# Patient Record
Sex: Female | Born: 1950 | Race: White | Hispanic: No | Smoking: Current every day smoker
Health system: Southern US, Community
[De-identification: ages and names within clinical notes are randomized; demographics above are authoritative.]

## PROBLEM LIST (undated history)

## (undated) DIAGNOSIS — I10 Essential (primary) hypertension: Secondary | ICD-10-CM

## (undated) DIAGNOSIS — R011 Cardiac murmur, unspecified: Secondary | ICD-10-CM

## (undated) DIAGNOSIS — M8580 Other specified disorders of bone density and structure, unspecified site: Secondary | ICD-10-CM

## (undated) DIAGNOSIS — Z780 Asymptomatic menopausal state: Secondary | ICD-10-CM

## (undated) DIAGNOSIS — K219 Gastro-esophageal reflux disease without esophagitis: Secondary | ICD-10-CM

## (undated) DIAGNOSIS — E785 Hyperlipidemia, unspecified: Secondary | ICD-10-CM

## (undated) DIAGNOSIS — A6009 Herpesviral infection of other urogenital tract: Secondary | ICD-10-CM

## (undated) HISTORY — DX: Other specified disorders of bone density and structure, unspecified site: M85.80

## (undated) HISTORY — DX: Asymptomatic menopausal state: Z78.0

## (undated) HISTORY — DX: Herpesviral infection of other urogenital tract: A60.09

## (undated) HISTORY — DX: Hyperlipidemia, unspecified: E78.5

## (undated) HISTORY — DX: Gastro-esophageal reflux disease without esophagitis: K21.9

## (undated) HISTORY — DX: Cardiac murmur, unspecified: R01.1

## (undated) HISTORY — DX: Essential (primary) hypertension: I10

---

## 1968-12-31 HISTORY — PX: GANGLION CYST EXCISION: SHX1691

## 2000-01-16 ENCOUNTER — Other Ambulatory Visit: Admission: RE | Admit: 2000-01-16 | Discharge: 2000-01-16 | Payer: Self-pay | Admitting: Obstetrics and Gynecology

## 2004-01-01 DIAGNOSIS — A6009 Herpesviral infection of other urogenital tract: Secondary | ICD-10-CM

## 2004-01-01 HISTORY — DX: Herpesviral infection of other urogenital tract: A60.09

## 2004-01-01 HISTORY — PX: PAROTID GLAND TUMOR EXCISION: SHX5221

## 2005-02-13 ENCOUNTER — Ambulatory Visit (HOSPITAL_COMMUNITY): Admission: RE | Admit: 2005-02-13 | Discharge: 2005-02-13 | Payer: Self-pay | Admitting: Family Medicine

## 2005-02-20 ENCOUNTER — Ambulatory Visit: Payer: Self-pay | Admitting: Internal Medicine

## 2005-02-22 ENCOUNTER — Ambulatory Visit: Payer: Self-pay | Admitting: Internal Medicine

## 2005-02-22 ENCOUNTER — Ambulatory Visit: Admission: RE | Admit: 2005-02-22 | Discharge: 2005-02-22 | Payer: Self-pay | Admitting: Internal Medicine

## 2011-02-07 ENCOUNTER — Encounter: Payer: Self-pay | Admitting: Internal Medicine

## 2011-02-12 ENCOUNTER — Encounter: Payer: Self-pay | Admitting: Internal Medicine

## 2011-02-12 ENCOUNTER — Institutional Professional Consult (permissible substitution) (INDEPENDENT_AMBULATORY_CARE_PROVIDER_SITE_OTHER): Payer: Self-pay | Admitting: Internal Medicine

## 2011-02-12 DIAGNOSIS — R042 Hemoptysis: Secondary | ICD-10-CM | POA: Insufficient documentation

## 2011-02-12 DIAGNOSIS — F17200 Nicotine dependence, unspecified, uncomplicated: Secondary | ICD-10-CM

## 2011-02-21 NOTE — Assessment & Plan Note (Signed)
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 Cleta Alberts Primary Provider/Referring Provider:  Dr. Cleta Alberts  CC:  Hemoptysis.  History of Present Illness: 60 yowf smoker eval for hemopytis  2006 at [facility]   with CT Chest and FOB all negative and resolved but continued to smoker with smoker's cough.    February 12, 2011   1st pulmonary office eval in EMR era cc new onset hemoptysis in setting of worse cough than usual while on way to [place] Jan 31  by car then p returned to [facility]   Feb 6 acute onset brb hemoptysis =    1 tsp x 3  ,  next day coughed up a couple of clots and since then started on abx Feb 8th  and  quit smoking and no blood since,  smokers cough gone.  no epistaxis .   Pt denies any significant sore throat, dysphagia, itching, sneezing,  nasal congestion or excess secretions,  fever, chills, sweats, unintended wt loss, pleuritic or exertional cp, hempoptysis, change in activity tolerance  orthopnea pnd or leg swelling  Pt also denies any obvious fluctuation in symptoms with weather or environmental change or other alleviating or aggravating factors.       Current Medications (verified): 1)  Doxycycline Hyclate 100 Mg Tabs (Doxycycline Hyclate) .Marland Kitchen.. 1 Two Times A Day Until Gone Per Dr Cleta Alberts  Allergies (verified): 1)  ! 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  Quit first of Feb 2012.  Smoked for approx 40 yrs up to 1 1/2 ppd. ETOH- wine occ  Review of Systems       The patient complains of coughing up blood.  The patient denies shortness of breath with activity, shortness of breath at rest, productive cough, non-productive cough, chest pain, irregular  heartbeats, acid heartburn, indigestion, loss of appetite, weight change, abdominal pain, difficulty swallowing, sore throat, tooth/dental problems, headaches, nasal congestion/difficulty breathing through nose, sneezing, itching, ear ache, anxiety, depression, hand/feet swelling, joint stiffness or pain, rash, change in color of mucus, and fever.    Vital Signs:  Patient profile:   60 year old female Height:      60.5 inches Weight:      114.25 pounds BMI:     22.02 O2 Sat:      100 % on Room air Temp:     98.3 degrees F oral Pulse rate:   74 / minute BP sitting:   150 / 66  (left arm)  Vitals Entered By: Vernie Murders (February 12, 2011 11:15 AM)  O2 Flow:  Room air  Physical Exam  Additional Exam:  anxious wf nad wt 114 February 12, 2011  HEENT: nl dentition, turbinates, and orophanx. Nl external ear canals without cough reflex NECK :  without JVD/Nodes/TM/ nl carotid upstrokes bilaterally LUNGS: no acc muscle use, clear to A and P bilaterally without cough on insp or exp maneuvers CV:  RRR  no s3 or murmur or increase in P2, no edema  ABD:  soft and nontender with nl excursion in the supine position. No bruits or organomegaly, bowel sounds nl MS:  warm without deformities, calf  tenderness, cyanosis or clubbing SKIN: warm and dry without lesions   NEURO:  alert, approp, no deficits     CXR  Procedure date:  02/07/2011  Findings:      wnl   Impression & Recommendations:  Problem # 1:  HEMOPTYSIS UNSPECIFIED (ICD-786.30)  Her updated medication list for this problem includes:    Doxycycline Hyclate 100 Mg Tabs (Doxycycline hyclate) .Marland Kitchen... 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   Discussed in detail all the  indications, usual  risks and alternatives  relative to the benefits with  patient who agrees to proceed with conservative f/u off cigarttes  Orders: Consultation Level III (04540)  Problem # 2:  SMOKER (ICD-305.1)  I reviewed the Flethcher curve with her that basically says if you quit smoking when your best day FEV1 is still well preserved it is highly unlikely you will progress to severe disease and informed the patient there was no medication on the market that has proven to change the curve or the likelihood of progression   Orders: Consultation Level III (98119)  Medications Added to Medication List This Visit: 1)  Doxycycline Hyclate 100 Mg Tabs (Doxycycline hyclate) .Marland Kitchen.. 1 two times a day until gone per dr Cleta Alberts  Patient Instructions: 1)  Most important of your care is stopping smoking now 2)  If coughing up blood comes back even traces then need to repeat a CT Chest and Sinus (call Almyra Free at [phone]) 3)  If your breathing and cough are not satisfactory after 6 weeks please schedule an office visit

## 2012-01-12 ENCOUNTER — Encounter: Payer: Self-pay | Admitting: Family Medicine

## 2012-02-04 ENCOUNTER — Encounter: Payer: Self-pay | Admitting: Family Medicine

## 2012-02-04 ENCOUNTER — Ambulatory Visit: Payer: Self-pay | Admitting: Family Medicine

## 2012-02-04 DIAGNOSIS — I10 Essential (primary) hypertension: Secondary | ICD-10-CM

## 2012-02-04 DIAGNOSIS — M8580 Other specified disorders of bone density and structure, unspecified site: Secondary | ICD-10-CM

## 2012-02-04 DIAGNOSIS — Z0000 Encounter for general adult medical examination without abnormal findings: Secondary | ICD-10-CM

## 2012-02-04 MED ORDER — LISINOPRIL-HYDROCHLOROTHIAZIDE 20-12.5 MG PO TABS: 1.0000 | ORAL_TABLET | Freq: Every day | ORAL | Status: DC

## 2012-02-04 NOTE — Assessment & Plan Note (Addendum)
Increase lisinopril-Hct to 20/12.5 daily Call with BP readings in 6 weeks Follow low salt diet Exercise 5 days a week 30 min day Draw CMET/LIPID

## 2012-02-04 NOTE — Progress Notes (Signed)
  Subjective:    Patient ID: Anita Calderon, female    DOB: April 26, 1951, 61 y.o.   MRN: [id number]  HPI Presents in follow up of hypertension. Compliant with meds without s/e. Not adding salt to foods. Exersising about 3 days a week.  Recent URI however still with post nasal drainage.  Review of Systems  Constitutional: Negative.   Respiratory: Negative for chest tightness.   Cardiovascular: Negative for chest pain.  Neurological: Negative for dizziness, weakness and light-headedness.       Objective:   Physical Exam  Constitutional: She appears well-developed and well-nourished.  HENT:  Mouth/Throat: Oropharyngeal exudate: clear pnd.  Neck: Neck supple. No thyromegaly present.  Cardiovascular: Normal rate, regular rhythm and normal heart sounds.  Decreased pulses: no lower ext edema.   Pulmonary/Chest: Effort normal and breath sounds normal.          Assessment & Plan:   1. HTN (hypertension)  lisinopril-hydrochlorothiazide (ZESTORETIC) 20-12.5 MG per tablet, Lipid panel, Comprehensive metabolic panel. Call with BP readings in 6 weeks.  2. Osteopenia    3. Health care maintenance     Follow up 6 months.

## 2012-02-04 NOTE — Assessment & Plan Note (Addendum)
  Pt to call [facility] and look into scholarshiip for free mammogram

## 2012-02-04 NOTE — Patient Instructions (Addendum)
Increase lisinopril-Hct to 20/12.5 daily Call with BP readings in 6 weeks Follow low salt diet Exercise 5 days a week 30 min day We drew today CMET/LIPID panel  Pt to call [facility] and look into scholarshiip for free mammogram

## 2012-02-05 LAB — COMPREHENSIVE METABOLIC PANEL
ALT: 11 U/L (ref 0–35)
BUN: 9 mg/dL (ref 6–23)
Chloride: 101 mEq/L (ref 96–112)
Creat: 0.71 mg/dL (ref 0.50–1.10)
Sodium: 134 mEq/L — ABNORMAL LOW (ref 135–145)
Total Bilirubin: 0.5 mg/dL (ref 0.3–1.2)
Total Protein: 6.7 g/dL (ref 6.0–8.3)

## 2012-02-05 LAB — LIPID PANEL
Cholesterol: 221 mg/dL — ABNORMAL HIGH (ref 0–200)
HDL: 73 mg/dL (ref >39)

## 2012-02-06 NOTE — Progress Notes (Signed)
Quick Note:  Please call and send patient copy of normal labs. Thanks! KR  ______

## 2012-06-10 ENCOUNTER — Ambulatory Visit: Payer: Self-pay | Admitting: Family Medicine

## 2012-06-10 VITALS — BP 150/78 | HR 71 | Temp 98.0°F | Resp 18 | Ht 61.0 in | Wt 111.0 lb

## 2012-06-10 DIAGNOSIS — H5789 Other specified disorders of eye and adnexa: Secondary | ICD-10-CM

## 2012-06-10 DIAGNOSIS — J310 Chronic rhinitis: Secondary | ICD-10-CM

## 2012-06-10 DIAGNOSIS — H5710 Ocular pain, unspecified eye: Secondary | ICD-10-CM

## 2012-06-10 MED ORDER — POLYMYXIN B-TRIMETHOPRIM 10000-0.1 UNIT/ML-% OP SOLN: 1.0000 [drp] | OPHTHALMIC | Status: AC

## 2012-06-10 NOTE — Progress Notes (Signed)
Patient Name: Anita Calderon Date of Birth: [birth year] Medical Record Number: [id number] Gender: female Date of Encounter: 06/10/2012  History of Present Illness:  Anita Calderon is a 62 y.o. very pleasant female patient who presents with the following:  Both eyes have felt irritated, "achy" and looked red for the last couple of months.  She has noted some "green and yellow" discharge for several weeks as well.  Vision is ok.  She does not wear any corrective lenses.  She has tried moisturizing drops, and anti- redness drops every other day or so. No photophobia.    Patient Active Problem List  Diagnoses  . SMOKER  . HEMOPTYSIS UNSPECIFIED  . HTN (hypertension)  . Osteopenia  . Health care maintenance   Past Medical History  Diagnosis Date  . Herpes genitalis in women 2005  . Hyperlipidemia   . Menopause   . Hypertension   . GERD (gastroesophageal reflux disease)   . Osteopenia    Past Surgical History  Procedure Date  . Ganglion cyst excision 1970  . Parotid gland tumor excision 2005   History  Substance Use Topics  . Smoking status: Never Smoker   . Smokeless tobacco: Not on file  . Alcohol Use: Not on file   Family History  Problem Relation Age of Onset  . Hypertension Father   . Heart disease Father   . Brain cancer Mother    Allergies  Allergen Reactions  . Ciprofloxacin     REACTION: hives    Medication list has been reviewed and updated.  Prior to Admission medications   Medication Sig Start Date End Date Taking? Authorizing Provider  lisinopril-hydrochlorothiazide (ZESTORETIC) 20-12.5 MG per tablet Take 1 tablet by mouth daily. 02/04/12 02/03/13 Yes Dois Davenport, MD  omeprazole (PRILOSEC) 10 MG capsule Take 10 mg by mouth daily.   Yes Dois Davenport, MD  aspirin 81 MG tablet Take 81 mg by mouth daily.    Dois Davenport, MD  calcium citrate-vitamin D 200-200 MG-UNIT TABS Take 1 tablet by mouth daily.    Dois Davenport, MD  fish oil-omega-3 fatty acids  1000 MG capsule Take 2 g by mouth daily.    Dois Davenport, MD    Review of Systems:  As per HPI- otherwise negative.   Physical Examination: Filed Vitals:   06/10/12 0925  BP: 150/78  Pulse: 71  Temp: 98 F (36.7 C)  Resp: 18   Filed Vitals:   06/10/12 0925  Height: 5\' 1"  (1.549 m)  Weight: 111 lb (50.349 kg)   Body mass index is 20.97 kg/(m^2).  GEN: WDWN, NAD, Non-toxic, A & O x 3 HEENT: Atraumatic, Normocephalic. Neck supple. No masses, No LAD.  Tm, nasal cavity and oropharynx wnl PEERL, EOMI.  Eyes are slightly injected and muddy bilaterally- appear irritated.  Conjunctivae slightly injected only.  Fundoscopic exam wnl Ears and Nose: No external deformity. CV: RRR, No M/G/R. No JVD. No thrill. No extra heart sounds. PULM: CTA B, no wheezes, crackles, rhonchi. No retractions. No resp. distress. No accessory muscle use. EXTR: No c/c/e NEURO Normal gait.  PSYCH: Normally interactive. Conversant. Not depressed or anxious appearing.  Calm demeanor.   Assessment and Plan: 1. Eye irritation  trimethoprim-polymyxin b (POLYTRIM) ophthalmic solution   Suspect that Iyari has an element of allergic conjunctivitis.  Will treat as above with polytrim for about a week, and also recommended that she buy GenTeal opthalmic lubricating gel.  She will call if not  better in the next week or so.  Sooner if worse.     Abbe Amsterdam, MD

## 2012-08-04 ENCOUNTER — Ambulatory Visit: Payer: Self-pay | Admitting: Family Medicine

## 2012-09-18 ENCOUNTER — Encounter: Payer: Self-pay | Admitting: Family Medicine

## 2012-09-18 ENCOUNTER — Ambulatory Visit: Payer: Self-pay | Admitting: Family Medicine

## 2012-09-18 VITALS — BP 142/74 | HR 75 | Temp 97.9°F | Resp 16 | Ht 61.0 in | Wt 110.0 lb

## 2012-09-18 DIAGNOSIS — I10 Essential (primary) hypertension: Secondary | ICD-10-CM

## 2012-09-18 LAB — COMPREHENSIVE METABOLIC PANEL
ALT: 15 U/L (ref 0–35)
AST: 18 U/L (ref 0–37)
Albumin: 5.1 g/dL (ref 3.5–5.2)
Alkaline Phosphatase: 59 U/L (ref 39–117)
BUN: 10 mg/dL (ref 6–23)
CO2: 26 mEq/L (ref 19–32)
Calcium: 9.9 mg/dL (ref 8.4–10.5)
Chloride: 95 mEq/L — ABNORMAL LOW (ref 96–112)
Creat: 0.76 mg/dL (ref 0.50–1.10)
Glucose, Bld: 89 mg/dL (ref 70–99)
Potassium: 4.1 mEq/L (ref 3.5–5.3)
Sodium: 134 mEq/L — ABNORMAL LOW (ref 135–145)
Total Bilirubin: 0.6 mg/dL (ref 0.3–1.2)
Total Protein: 7.3 g/dL (ref 6.0–8.3)

## 2012-09-18 LAB — TSH: TSH: 1.07 u[IU]/mL (ref 0.350–4.500)

## 2012-09-18 NOTE — Progress Notes (Signed)
61 yo woman with hypertension, treated with lisinopril HCT for 18 months.  Still smoking.   C/o GERD  In past 4 months, patient has had several episodes of diaphoresis, weakness.  These episodes are brief.  One episode associated with vomiting.  Objective:  Alert, articulate HEENT:  Unremarkable Neck:  Faint bruit right carotid area Chest: clear Heart:  II/VI RSB systolic murmur Extrem:  Normal  Had cardiac eval 2 years ago with echo which was normal.  Assessment:  Atypical episodes of brief presyncope.  Plan:  1. Hypertension  Comprehensive metabolic panel, TSH

## 2012-09-18 NOTE — Patient Instructions (Signed)

## 2013-04-01 ENCOUNTER — Other Ambulatory Visit: Payer: Self-pay | Admitting: Family Medicine

## 2013-05-04 ENCOUNTER — Encounter: Payer: Self-pay | Admitting: Family Medicine

## 2013-05-04 ENCOUNTER — Ambulatory Visit (INDEPENDENT_AMBULATORY_CARE_PROVIDER_SITE_OTHER): Payer: Self-pay | Admitting: Family Medicine

## 2013-05-04 VITALS — BP 130/70 | HR 66 | Temp 98.0°F | Resp 16 | Ht 61.0 in | Wt 106.0 lb

## 2013-05-04 DIAGNOSIS — I10 Essential (primary) hypertension: Secondary | ICD-10-CM

## 2013-05-04 MED ORDER — LISINOPRIL-HYDROCHLOROTHIAZIDE 20-12.5 MG PO TABS: 1.0000 | ORAL_TABLET | Freq: Every day | ORAL | Status: DC

## 2013-05-04 NOTE — Progress Notes (Signed)
Patient ID: Anita Calderon MRN: [id number], DOB: April 05, 1951, 62 y.o. Date of Encounter: 05/04/2013, 12:17 PM  Primary Physician: No primary provider on file.  Chief Complaint: HTN  HPI: 62 y.o. year old female with history below presents for hypertension follow up.  Diet consists of No CP, HA, visual changes, or focal deficits.   Past Medical History  Diagnosis Date  . Herpes genitalis in women 2005  . Hyperlipidemia   . Menopause   . Hypertension   . GERD (gastroesophageal reflux disease)   . Osteopenia      Home Meds: Prior to Admission medications   Medication Sig Start Date End Date Taking? Authorizing Provider  lisinopril-hydrochlorothiazide (PRINZIDE,ZESTORETIC) 20-12.5 MG per tablet take 1 tablet by mouth once daily 04/01/13  Yes Sondra Barges, PA-C    Allergies:  Allergies  Allergen Reactions  . Ciprofloxacin     REACTION: hives    History   Social History  . Marital Status: Married    Spouse Name: N/A    Number of Children: N/A  . Years of Education: N/A   Occupational History  . Not on file.   Social History Main Topics  . Smoking status: Current Every Day Smoker    Last Attempt to Quit: 04/11/2010  . Smokeless tobacco: Not on file  . Alcohol Use: Not on file  . Drug Use: No  . Sexually Active: Not on file   Other Topics Concern  . Not on file   Social History Narrative  . No narrative on file     Family History  Problem Relation Age of Onset  . Hypertension Father   . Heart disease Father   . Brain cancer Mother     Review of Systems: Constitutional: negative for chills, fever, night sweats, weight changes, or fatigue  HEENT: negative for vision changes, hearing loss, congestion, rhinorrhea, ST, epistaxis, or sinus pressure Cardiovascular: negative for chest pain, palpitations, or DOE Respiratory: negative for hemoptysis, wheezing, shortness of breath, or cough Abdominal: negative for abdominal pain, nausea, vomiting, diarrhea, or  constipation Dermatological: negative for rash Neurologic: negative for headache, dizziness, or syncope All other systems reviewed and are otherwise negative with the exception to those above and in the HPI.   Physical Exam: Blood pressure 130/70, pulse 66, temperature 98 F (36.7 C), temperature source Oral, resp. rate 16, height 5\' 1"  (1.549 m), weight 106 lb (48.081 kg), SpO2 100.00%., Body mass index is 20.04 kg/(m^2). General: Well developed, well nourished, in no acute distress. Head: Normocephalic, atraumatic, eyes without discharge, sclera non-icteric, nares are without discharge. Bilateral auditory canals clear, TM's are without perforation, pearly grey and translucent with reflective cone of light bilaterally. Oral cavity moist, posterior pharynx without exudate, erythema, peritonsillar abscess, or post nasal drip.  Neck: Supple. No thyromegaly. Full ROM. No lymphadenopathy. No carotid bruits. Lungs: Clear bilaterally to auscultation without wheezes, rales, or rhonchi. Breathing is unlabored. Heart: RRR with S1 S2. No murmurs, rubs, or gallops appreciated.  Abdomen: Soft, non-tender, non-distended with normoactive bowel sounds. No hepatosplenomegaly. No rebound/guarding. No obvious abdominal masses. Msk:  Strength and tone normal for age. Extremities/Skin: Warm and dry. No clubbing or cyanosis. No edema. No rashes or suspicious lesions. Distal pulses 2+ and equal bilaterally. Neuro: Alert and oriented X 3. Moves all extremities spontaneously. Gait is normal. CNII-XII grossly in tact. DTR 2+, cerebellar function intact. Rhomberg normal. Psych:  Responds to questions appropriately with a normal affect.    ASSESSMENT AND PLAN:  62 y.o. year  old female with chronic hypertension  Hypertension - Plan: lisinopril-hydrochlorothiazide (PRINZIDE,ZESTORETIC) 20-12.5 MG per tablet    - Advise smding cessation Signed, Elvina Sidle, MD 05/04/2013 12:17 PM

## 2013-11-18 ENCOUNTER — Ambulatory Visit: Payer: Self-pay | Admitting: Family Medicine

## 2013-11-18 VITALS — BP 166/68 | HR 73 | Temp 98.6°F | Resp 16 | Ht 61.0 in | Wt 109.4 lb

## 2013-11-18 DIAGNOSIS — I10 Essential (primary) hypertension: Secondary | ICD-10-CM

## 2013-11-18 DIAGNOSIS — E871 Hypo-osmolality and hyponatremia: Secondary | ICD-10-CM

## 2013-11-18 DIAGNOSIS — J209 Acute bronchitis, unspecified: Secondary | ICD-10-CM

## 2013-11-18 DIAGNOSIS — R05 Cough: Secondary | ICD-10-CM

## 2013-11-18 LAB — COMPREHENSIVE METABOLIC PANEL
ALT: 10 U/L (ref 0–35)
AST: 15 U/L (ref 0–37)
Albumin: 4.6 g/dL (ref 3.5–5.2)
Alkaline Phosphatase: 75 U/L (ref 39–117)
BUN: 8 mg/dL (ref 6–23)
Calcium: 9.6 mg/dL (ref 8.4–10.5)
Glucose, Bld: 94 mg/dL (ref 70–99)
Total Protein: 6.8 g/dL (ref 6.0–8.3)

## 2013-11-18 MED ORDER — DOXYCYCLINE HYCLATE 100 MG PO TABS: 100.0000 mg | ORAL_TABLET | Freq: Two times a day (BID) | ORAL | Status: DC

## 2013-11-18 NOTE — Patient Instructions (Signed)
Keep an eye on your blood pressure.  If it does not come back to around 140/80 please let us know.    Use the doxycycline as directed for the next 10 days.  Let me know if you are not getting better Take the doxycycline with food and water  Congratulations on quitting smoking- keep it up!  This is a great step in improving your health

## 2013-11-18 NOTE — Progress Notes (Addendum)
[facility] [address] [place] [address] [phone]  Date:  11/18/2013   Name:  Anita Calderon   DOB:  May 11, 1951   MRN:  [id number]  PCP:  No primary provider on file.    Chief Complaint: Bronchitis   History of Present Illness:  Anita Calderon is a 62 y.o. very pleasant female patient who presents with the following:  About one week ago she noted a "head cold" and "now it's in my chest.  She has a history of "chronic bronchitis." Her cough gets much worse at night.  She is coughing up some material.   She has noted a slight temperature.  She has noted chills early in the illness, and her eyes ache.  She has noted an intermittent ST.  She still has a runny, stuffy nose.    No GI symptoms.  She does have wheezing at night.   She does note a mild HA.    History of HTN.   She "has been told that I have chronic bronchiits."  Her pulmonologist is Dr. Sherene Sires  Patient Active Problem List   Diagnosis Date Noted  . HTN (hypertension) 02/04/2012  . Osteopenia 02/04/2012  . Health care maintenance 02/04/2012  . SMOKER 02/12/2011  . HEMOPTYSIS UNSPECIFIED 02/12/2011    Past Medical History  Diagnosis Date  . Herpes genitalis in women 2005  . Hyperlipidemia   . Menopause   . Hypertension   . GERD (gastroesophageal reflux disease)   . Osteopenia     Past Surgical History  Procedure Laterality Date  . Ganglion cyst excision  1970  . Parotid gland tumor excision  2005    History  Substance Use Topics  . Smoking status: Current Every Day Smoker    Last Attempt to Quit: 04/11/2010  . Smokeless tobacco: Not on file  . Alcohol Use: Not on file    Family History  Problem Relation Age of Onset  . Hypertension Father   . Heart disease Father   . Brain cancer Mother     Allergies  Allergen Reactions  . Ciprofloxacin     REACTION: hives    Medication list has been reviewed and updated.  Current Outpatient Prescriptions on File Prior to Visit   Medication Sig Dispense Refill  . lisinopril-hydrochlorothiazide (PRINZIDE,ZESTORETIC) 20-12.5 MG per tablet Take 1 tablet by mouth daily.  90 tablet  3   No current facility-administered medications on file prior to visit.    Review of Systems:  As per HPI- otherwise negative.   Physical Examination: Filed Vitals:   11/18/13 1418  BP: 166/68  Pulse: 73  Temp: 98.6 F (37 C)  Resp: 16   Filed Vitals:   11/18/13 1418  Height: 5\' 1"  (1.549 m)  Weight: 109 lb 6.4 oz (49.624 kg)   Body mass index is 20.68 kg/(m^2). Ideal Body Weight: Weight in (lb) to have BMI = 25: 132  GEN: WDWN, NAD, Non-toxic, A & O x 3, looks well HEENT: Atraumatic, Normocephalic. Neck supple. No masses, No LAD.  Bilateral TM wnl, oropharynx normal.  PEERL,EOMI.   Ears and Nose: No external deformity. CV: RRR, No M/G/R. No JVD. No thrill. No extra heart sounds. PULM: CTA B, no wheezes, crackles, rhonchi. No retractions. No resp. distress. No accessory muscle use. EXTR: No c/c/e NEURO Normal gait.  PSYCH: Normally interactive. Conversant. Not depressed or anxious appearing.  Calm demeanor.    Assessment and Plan: Cough - Plan: doxycycline (VIBRA-TABS) 100  MG tablet  Acute bronchitis - Plan: doxycycline (VIBRA-TABS) 100 MG tablet  HTN (hypertension) - Plan: Comprehensive metabolic panel  History of smoking (now quit!) with likely exacerbation of her chronic bronchitis.  tx with doxycycline Her BP is up some today but she has not slept well and has used OTC cold preps.  Asked her to keep an eye on this and let me know if not better soon.  Check labs today as she is overdue  Signed Abbe Amsterdam, MD  Called on 11/20: no answer so left detailed message on machine. She has hyponatremia down to 131; likely due to HCTZ use.  I will send in an rx for plain lisinopril 20 mg. She should hold the lisinopril/ hctz and use just the lisinopril.  She may need to take 30 or 40 mg; we will have her monitor her  BP to see how much she needs and follow-up closely. I will get back in touch with her tomorrow    11/21: called and spoke with her.  She did get my message and picked up the lisinopril.  She took one this am, and noted that her SBP is in the 160s tonight.  Instructed her to go ahead and take another 10mg  of lisinopril tonight. She may do this for a couple of days, then go to 20 mg BID.   She will come by for a lab visit only in about one week for a BMP

## 2013-11-19 MED ORDER — LISINOPRIL 20 MG PO TABS: ORAL_TABLET | ORAL | Status: DC

## 2013-11-19 NOTE — Addendum Note (Signed)
Addended by: Abbe Amsterdam C on: 11/19/2013 06:07 PM   Modules accepted: Orders

## 2013-11-20 ENCOUNTER — Encounter: Payer: Self-pay | Admitting: Family Medicine

## 2013-11-20 NOTE — Addendum Note (Signed)
Addended by: Abbe Amsterdam C on: 11/20/2013 08:12 PM   Modules accepted: Orders, Medications

## 2014-03-25 ENCOUNTER — Other Ambulatory Visit: Payer: Self-pay | Admitting: Family Medicine

## 2014-04-05 ENCOUNTER — Ambulatory Visit (INDEPENDENT_AMBULATORY_CARE_PROVIDER_SITE_OTHER): Payer: Medicare Other | Admitting: Family Medicine

## 2014-04-05 VITALS — BP 152/85 | HR 72 | Temp 98.1°F | Resp 16 | Ht 61.0 in | Wt 112.0 lb

## 2014-04-05 DIAGNOSIS — I10 Essential (primary) hypertension: Secondary | ICD-10-CM

## 2014-04-05 DIAGNOSIS — Z13220 Encounter for screening for lipoid disorders: Secondary | ICD-10-CM

## 2014-04-05 LAB — BASIC METABOLIC PANEL
BUN: 16 mg/dL (ref 6–23)
CALCIUM: 10 mg/dL (ref 8.4–10.5)
CHLORIDE: 101 meq/L (ref 96–112)
CO2: 27 meq/L (ref 19–32)
CREATININE: 0.81 mg/dL (ref 0.50–1.10)
GLUCOSE: 86 mg/dL (ref 70–99)
Potassium: 4.6 mEq/L (ref 3.5–5.3)
Sodium: 138 mEq/L (ref 135–145)

## 2014-04-05 LAB — LIPID PANEL
CHOL/HDL RATIO: 2.5 ratio
CHOLESTEROL: 274 mg/dL — AB (ref 0–200)
HDL: 111 mg/dL (ref >39)
LDL CALC: 147 mg/dL — AB (ref 0–99)
TRIGLYCERIDES: 78 mg/dL (ref <150)
VLDL: 16 mg/dL (ref 0–40)

## 2014-04-05 MED ORDER — LISINOPRIL 40 MG PO TABS: ORAL_TABLET | ORAL | Status: DC

## 2014-04-05 NOTE — Patient Instructions (Addendum)
Recently, a new recommendation has been made regarding screening for lung cancer using annual "low dose" CT scanning.  This service is recommended for people who are 14- 65 years old, who currently smoke or quit in the last 15 years, and who smoked at least a pack per day for 30 years or more.    Some patients who are at least 65 years old and who smoked a pack per day for 20 years, or were exposed to second hand smoke may also qualify for screening.    In [place] this service is available at [facility], [phone]. The exam costs about $300 but may be covered by insurance.  Give them a call and find out what your insurance coverage would be.    For the time being continue taking your lisinopril; depending on your lab results we may add back some HCTZ or add norvasc instead.   I will be in touch with your labs asap

## 2014-04-05 NOTE — Progress Notes (Addendum)
[facility] [address] [place] [address] [phone]  Date:  04/05/2014   Name:  Anita Calderon   DOB:  Oct 20, 1951   MRN:  [id number]  PCP:  No primary provider on file.    Chief Complaint: Medication Refill   History of Present Illness:  Anita Calderon is a 63 y.o. very pleasant female patient who presents with the following:  Here today for a BP medication refill- she is not out and DID take her lisinopril today.   Her BP at our last visit in November was 166/68; however she had been on lisinopril/ hctz and we had to stop her hctz due to hyponatremia.  She is on lisinopril 40mg  a day currently She is feeling well- no headache, no CP.   No history of any heart problems except for HTN, and she has a murmur- she has had a negative echo twice.    She had a couple of bites of an omlett this am; otherwise is fasting.    She did quit smoking 09/04/2013.  She did smoke for about 40 years.    We stopped her HCTZ in November as her sodium was little bit low.  She now notes some mild swelling.  Will recheck today  Admits she is under a lot of stress as her husband is in the final stages of liver cancer.  He has mental status changes due to ammonia levels.    Patient Active Problem List   Diagnosis Date Noted  . HTN (hypertension) 02/04/2012  . Osteopenia 02/04/2012  . Health care maintenance 02/04/2012  . SMOKER 02/12/2011  . HEMOPTYSIS UNSPECIFIED 02/12/2011    Past Medical History  Diagnosis Date  . Herpes genitalis in women 2005  . Hyperlipidemia   . Menopause   . Hypertension   . GERD (gastroesophageal reflux disease)   . Osteopenia     Past Surgical History  Procedure Laterality Date  . Ganglion cyst excision  1970  . Parotid gland tumor excision  2005    History  Substance Use Topics  . Smoking status: Current Every Day Smoker    Last Attempt to Quit: 04/11/2010  . Smokeless tobacco: Not on file  . Alcohol Use: Not on file    Family  History  Problem Relation Age of Onset  . Hypertension Father   . Heart disease Father   . Brain cancer Mother     Allergies  Allergen Reactions  . Ciprofloxacin     REACTION: hives    Medication list has been reviewed and updated.  Current Outpatient Prescriptions on File Prior to Visit  Medication Sig Dispense Refill  . lisinopril (PRINIVIL,ZESTRIL) 20 MG tablet take 1 or 2 tablets by mouth once daily as directed  60 tablet  1   No current facility-administered medications on file prior to visit.    Review of Systems:  As per HPI- otherwise negative.   Physical Examination: Filed Vitals:   04/05/14 1055  BP: 184/82  Pulse: 72  Temp: 98.1 F (36.7 C)  Resp: 16   Filed Vitals:   04/05/14 1055  Height: 5\' 1"  (1.549 m)  Weight: 112 lb (50.803 kg)   Body mass index is 21.17 kg/(m^2). Ideal Body Weight: Weight in (lb) to have BMI = 25: 132  GEN: WDWN, NAD, Non-toxic, A & O x 3, slim build, looks well HEENT: Atraumatic, Normocephalic. Neck supple. No masses, No LAD. Ears and Nose: No external deformity. CV: RRR, No  M/G/R. No JVD. No thrill. No extra heart sounds. PULM: CTA B, no wheezes, crackles, rhonchi. No retractions. No resp. distress. No accessory muscle use. ABD: S, NT, ND, +BS. No rebound. No HSM. EXTR: No c/c/e NEURO Normal gait.  PSYCH: Normally interactive. Conversant. Not depressed or anxious appearing.  Calm demeanor.    Assessment and Plan: HTN (hypertension) - Plan: lisinopril (PRINIVIL,ZESTRIL) 40 MG tablet, Basic metabolic panel, Lipid panel  Screening for hyperlipidemia - Plan: Lipid panel  Refilled her lisinporil.  Discussed amlodipine; however she is concerned that she has a little swelling since she stopped her hctz.  If her sodium level is ok we may be able to start a low dose of hctz again.   Check FLP also  Signed Lamar Blinks, MD  Called on 04/07/14 to go over labs  Results for orders placed in visit on 65/03/54  BASIC  METABOLIC PANEL      Result Value Ref Range   Sodium 138  135 - 145 mEq/L   Potassium 4.6  3.5 - 5.3 mEq/L   Chloride 101  96 - 112 mEq/L   CO2 27  19 - 32 mEq/L   Glucose, Bld 86  70 - 99 mg/dL   BUN 16  6 - 23 mg/dL   Creat 0.81  0.50 - 1.10 mg/dL   Calcium 10.0  8.4 - 10.5 mg/dL  LIPID PANEL      Result Value Ref Range   Cholesterol 274 (*) 0 - 200 mg/dL   Triglycerides 78  <150 mg/dL   HDL 111  >39 mg/dL   Total CHOL/HDL Ratio 2.5     VLDL 16  0 - 40 mg/dL   LDL Cholesterol 147 (*) 0 - 99 mg/dL   Although her LDL and total chl are high, her ratio is still v good due to excellent HDL.  She does not wish to start any chl medication at this time.  She does have not have DM or CHL, so this is a reasonable choice.  Her sodium is again normal, so mild hyponatremia likely was due to HCTZ.  Will add amlodipine to her BP regiment at 5mg

## 2014-04-07 ENCOUNTER — Encounter: Payer: Self-pay | Admitting: Family Medicine

## 2014-04-07 MED ORDER — AMLODIPINE BESYLATE 5 MG PO TABS: ORAL_TABLET | ORAL | Status: DC

## 2014-04-07 NOTE — Addendum Note (Signed)
Addended by: Lamar Blinks C on: 04/07/2014 12:23 PM   Modules accepted: Orders

## 2014-07-13 ENCOUNTER — Other Ambulatory Visit: Payer: Self-pay | Admitting: Family Medicine

## 2014-07-14 NOTE — Telephone Encounter (Signed)
Phone call to patient, what is her current dose? Is she taking 1/2 or now is she using 1/ day. She is taking one daily, this is sent.

## 2014-12-06 ENCOUNTER — Ambulatory Visit (INDEPENDENT_AMBULATORY_CARE_PROVIDER_SITE_OTHER): Payer: Medicare Other

## 2014-12-06 ENCOUNTER — Ambulatory Visit (INDEPENDENT_AMBULATORY_CARE_PROVIDER_SITE_OTHER): Payer: Medicare Other | Admitting: Family Medicine

## 2014-12-06 ENCOUNTER — Ambulatory Visit (INDEPENDENT_AMBULATORY_CARE_PROVIDER_SITE_OTHER): Payer: Medicare Other | Admitting: *Deleted

## 2014-12-06 VITALS — BP 146/66 | HR 81 | Temp 98.2°F | Resp 18 | Ht 61.0 in | Wt 113.4 lb

## 2014-12-06 DIAGNOSIS — R059 Cough, unspecified: Secondary | ICD-10-CM

## 2014-12-06 DIAGNOSIS — R05 Cough: Secondary | ICD-10-CM

## 2014-12-06 DIAGNOSIS — J988 Other specified respiratory disorders: Secondary | ICD-10-CM

## 2014-12-06 DIAGNOSIS — Z23 Encounter for immunization: Secondary | ICD-10-CM

## 2014-12-06 DIAGNOSIS — I10 Essential (primary) hypertension: Secondary | ICD-10-CM

## 2014-12-06 DIAGNOSIS — Z2821 Immunization not carried out because of patient refusal: Secondary | ICD-10-CM

## 2014-12-06 DIAGNOSIS — J22 Unspecified acute lower respiratory infection: Secondary | ICD-10-CM

## 2014-12-06 MED ORDER — AZITHROMYCIN 250 MG PO TABS: ORAL_TABLET | ORAL | Status: DC

## 2014-12-06 MED ORDER — LOSARTAN POTASSIUM 50 MG PO TABS: 50.0000 mg | ORAL_TABLET | Freq: Every day | ORAL | Status: DC

## 2014-12-06 NOTE — Progress Notes (Signed)
 Chief Complaint:  Chief Complaint  Patient presents with  . Cough    x 1 month  productive cough  . Wheezing    HPI: Anita Calderon is a 63 y.o. female who is here for  1 month hx of headand chest congestion for 1 month off and on, has had recurrent cold and this past Thursday the cough went deeper and she knows when she has bronchitis, last year she ahd PNA, she has had some diarrhea , nonbloody but no nausea, vomting , abd pain or chest pain. No SOB or palpitations. She was wheezing then it gets better after she is done coughing, she takes Delsyn, no wheezing since last week, She was in bed all day fridaya nd Sturday, today she feels better and then feels bad again. She is coughing up thick mucus. No ear or facial pain. Has sore throat.  Denies allergies, asthma. She is a smoker. She gets bronchitis once a year, has ahd doxycyline and zpack in the past.   She has had HTN for a while. No CP No SOB. She has had dry cough. She has been on norvasc before and made her swell, she has ahd problems with diuretics and they made her electro;ytes go low.   Past Medical History  Diagnosis Date  . Herpes genitalis in women 2005  . Hyperlipidemia   . Menopause   . Hypertension   . GERD (gastroesophageal reflux disease)   . Osteopenia    Past Surgical History  Procedure Laterality Date  . Ganglion cyst excision  1970  . Parotid gland tumor excision  2005   History   Social History  . Marital Status: Married    Spouse Name: N/A    Number of Children: N/A  . Years of Education: N/A   Social History Main Topics  . Smoking status: Current Every Day Smoker  . Smokeless tobacco: None  . Alcohol Use: None  . Drug Use: No  . Sexual Activity: No   Other Topics Concern  . None   Social History Narrative   Family History  Problem Relation Age of Onset  . Hypertension Father   . Heart disease Father   . Brain cancer Mother    Allergies  Allergen Reactions  . Ciprofloxacin    REACTION: hives   Prior to Admission medications   Medication Sig Start Date End Date Taking? Authorizing Provider  lisinopril (PRINIVIL,ZESTRIL) 40 MG tablet take 1 tablet daily. Please deliver to patient's home 04/05/14  Yes Gay Filler Copland, MD     ROS: The patient denies fevers, chills, night sweats, unintentional weight loss, chest pain, palpitations, wheezing, dyspnea on exertion, nausea, vomiting, abdominal pain, dysuria, hematuria, melena, numbness, weakness, or tingling.   All other systems have been reviewed and were otherwise negative with the exception of those mentioned in the HPI and as above.    PHYSICAL EXAM: Filed Vitals:   12/06/14 0822  BP: 146/66  Pulse: 81  Temp: 98.2 F (36.8 C)  Resp: 18   Filed Vitals:   12/06/14 0822  Height: 5\' 1"  (1.549 m)  Weight: 113 lb 6.4 oz (51.438 kg)   Body mass index is 21.44 kg/(m^2).  General: Alert, no acute distress HEENT:  Normocephalic, atraumatic, oropharynx patent. EOMI, PERRLA, Tm nl, + erythematous throat, no exudates Cardiovascular:  Regular rate and rhythm, no rubs murmurs or gallops.  No Carotid bruits, radial pulse intact. No pedal edema.  Respiratory: Clear to auscultation bilaterally.  No wheezes, rales, or rhonchi.  No cyanosis, no use of accessory musculature GI: No organomegaly, abdomen is soft and non-tender, positive bowel sounds.  No masses. Skin: No rashes. Neurologic: Facial musculature symmetric. Psychiatric: Patient is appropriate throughout our interaction. Lymphatic: No cervical lymphadenopathy Musculoskeletal: Gait intact.   LABS: Results for orders placed or performed in visit on 85/02/77  Basic metabolic panel  Result Value Ref Range   Sodium 138 135 - 145 mEq/L   Potassium 4.6 3.5 - 5.3 mEq/L   Chloride 101 96 - 112 mEq/L   CO2 27 19 - 32 mEq/L   Glucose, Bld 86 70 - 99 mg/dL   BUN 16 6 - 23 mg/dL   Creat 0.81 0.50 - 1.10 mg/dL   Calcium 10.0 8.4 - 10.5 mg/dL  Lipid panel  Result  Value Ref Range   Cholesterol 274 (H) 0 - 200 mg/dL   Triglycerides 78 <150 mg/dL   HDL 111 >39 mg/dL   Total CHOL/HDL Ratio 2.5 Ratio   VLDL 16 0 - 40 mg/dL   LDL Cholesterol 147 (H) 0 - 99 mg/dL     EKG/XRAY:   Primary read interpreted by Dr. Marin Comment at [facility]. Increase vascular markings   ASSESSMENT/PLAN: Encounter Diagnoses  Name Primary?  . Cough Yes  . Essential hypertension   . Lower respiratory infection   . Influenza vaccination declined    Pleasant 63 year old female with HTN on ACEI and also lower respiratory infection for the last 1 month off and on, lingering cough, hx of bronchitis and smoker.  Rx azithromycin Rx Losaratn and stop lisinopril F/u with Dr Lorelei Pont in 1 month, monitor BP goal is less than 150/90 Decliens flu vaccine  Gross sideeffects, risk and benefits, and alternatives of medications d/w patient. Patient is aware that all medications have potential sideeffects and we are unable to predict every sideeffect or drug-drug interaction that may occur.  , [place], DO 12/06/2014 9:32 AM

## 2014-12-27 ENCOUNTER — Encounter: Payer: Self-pay | Admitting: Family Medicine

## 2014-12-27 ENCOUNTER — Ambulatory Visit (INDEPENDENT_AMBULATORY_CARE_PROVIDER_SITE_OTHER): Payer: Medicare Other | Admitting: Family Medicine

## 2014-12-27 VITALS — BP 120/72 | HR 83 | Temp 98.2°F | Resp 16 | Ht 61.0 in | Wt 113.4 lb

## 2014-12-27 DIAGNOSIS — R05 Cough: Secondary | ICD-10-CM | POA: Diagnosis not present

## 2014-12-27 DIAGNOSIS — Z124 Encounter for screening for malignant neoplasm of cervix: Secondary | ICD-10-CM | POA: Diagnosis not present

## 2014-12-27 DIAGNOSIS — M8580 Other specified disorders of bone density and structure, unspecified site: Secondary | ICD-10-CM

## 2014-12-27 DIAGNOSIS — Z720 Tobacco use: Secondary | ICD-10-CM | POA: Diagnosis not present

## 2014-12-27 DIAGNOSIS — M899 Disorder of bone, unspecified: Secondary | ICD-10-CM | POA: Diagnosis not present

## 2014-12-27 DIAGNOSIS — F17200 Nicotine dependence, unspecified, uncomplicated: Secondary | ICD-10-CM

## 2014-12-27 DIAGNOSIS — Z13220 Encounter for screening for lipoid disorders: Secondary | ICD-10-CM

## 2014-12-27 DIAGNOSIS — Z139 Encounter for screening, unspecified: Secondary | ICD-10-CM

## 2014-12-27 DIAGNOSIS — Z0000 Encounter for general adult medical examination without abnormal findings: Secondary | ICD-10-CM

## 2014-12-27 DIAGNOSIS — Z1239 Encounter for other screening for malignant neoplasm of breast: Secondary | ICD-10-CM | POA: Diagnosis not present

## 2014-12-27 DIAGNOSIS — I10 Essential (primary) hypertension: Secondary | ICD-10-CM | POA: Diagnosis not present

## 2014-12-27 DIAGNOSIS — R059 Cough, unspecified: Secondary | ICD-10-CM

## 2014-12-27 LAB — LIPID PANEL
CHOL/HDL RATIO: 3.3 ratio
CHOLESTEROL: 239 mg/dL — AB (ref 0–200)
HDL: 72 mg/dL (ref >39)
LDL Cholesterol: 128 mg/dL — ABNORMAL HIGH (ref 0–99)
Triglycerides: 194 mg/dL — ABNORMAL HIGH (ref <150)
VLDL: 39 mg/dL (ref 0–40)

## 2014-12-27 LAB — CBC
HCT: 41.4 % (ref 36.0–46.0)
Hemoglobin: 14 g/dL (ref 12.0–15.0)
MCH: 33.4 pg (ref 26.0–34.0)
MCHC: 33.8 g/dL (ref 30.0–36.0)
MCV: 98.8 fL (ref 78.0–100.0)
MPV: 11.1 fL (ref 9.4–12.4)
PLATELETS: 266 10*3/uL (ref 150–400)
RBC: 4.19 MIL/uL (ref 3.87–5.11)
RDW: 12.6 % (ref 11.5–15.5)
WBC: 8.1 10*3/uL (ref 4.0–10.5)

## 2014-12-27 LAB — COMPREHENSIVE METABOLIC PANEL
ALBUMIN: 4.7 g/dL (ref 3.5–5.2)
ALT: 13 U/L (ref 0–35)
AST: 18 U/L (ref 0–37)
Alkaline Phosphatase: 61 U/L (ref 39–117)
BUN: 6 mg/dL (ref 6–23)
CHLORIDE: 101 meq/L (ref 96–112)
CO2: 24 meq/L (ref 19–32)
CREATININE: 0.77 mg/dL (ref 0.50–1.10)
Calcium: 9.5 mg/dL (ref 8.4–10.5)
Glucose, Bld: 72 mg/dL (ref 70–99)
POTASSIUM: 4.1 meq/L (ref 3.5–5.3)
Sodium: 135 mEq/L (ref 135–145)
Total Bilirubin: 0.4 mg/dL (ref 0.2–1.2)
Total Protein: 7.3 g/dL (ref 6.0–8.3)

## 2014-12-27 LAB — TSH: TSH: 1.114 u[IU]/mL (ref 0.350–4.500)

## 2014-12-27 MED ORDER — PREDNISONE 20 MG PO TABS: ORAL_TABLET | ORAL | Status: DC

## 2014-12-27 NOTE — Progress Notes (Signed)
[facility] [address] [place] [address] [phone]  Date:  12/27/2014   Name:  Anita Calderon   DOB:  09-09-1951   MRN:  [id number]  PCP:  No primary care provider on file.    Chief Complaint: Annual Exam   History of Present Illness:  Anita Calderon is a 63 y.o. very pleasant female patient who presents with the following:  Here today for a CPE.  Her husband died of liver cancer in 05-02-23 of this year but she is doing ok.  History of HTN and osteopenia.  Taking losartan 50 mg Last mamo "years ago."   Never had a colonoscopy She has not had the shingles shot but would like to have his.   No recent bone density scan.  She did have a scan years ago that showed osteopenia.  She states that she did NOT have a flu shot this year.  She declines to have one now.  Unsure date of last pap but it has been a long time.  No history of abnormal pap.   She was seen on 12/7 and treated with a zpack for lower respiratory infection.  She is frustrated by continued cough.  She restarted smoking in 05/02/23 when her husband passed away.  She is still coughing, it can be productive.  She does not have any fever.  She does not really have SOB or decreased exercise tolerance but is bothered by her cough.  She smoked for a long time prior to quitting and then restarting.  Knows that she needs to quit again   Patient Active Problem List   Diagnosis Date Noted  . HTN (hypertension) 02/04/2012  . Osteopenia 02/04/2012  . Health care maintenance 02/04/2012  . SMOKER 02/12/2011  . HEMOPTYSIS UNSPECIFIED 02/12/2011    Past Medical History  Diagnosis Date  . Herpes genitalis in women 2005  . Hyperlipidemia   . Menopause   . Hypertension   . GERD (gastroesophageal reflux disease)   . Osteopenia     Past Surgical History  Procedure Laterality Date  . Ganglion cyst excision  1970  . Parotid gland tumor excision  2005    History  Substance Use Topics  . Smoking status:  Current Every Day Smoker  . Smokeless tobacco: Not on file  . Alcohol Use: Not on file    Family History  Problem Relation Age of Onset  . Hypertension Father   . Heart disease Father   . Brain cancer Mother     Allergies  Allergen Reactions  . Ciprofloxacin     REACTION: hives    Medication list has been reviewed and updated.  Current Outpatient Prescriptions on File Prior to Visit  Medication Sig Dispense Refill  . losartan (COZAAR) 50 MG tablet Take 1 tablet (50 mg total) by mouth daily. 30 tablet 5   No current facility-administered medications on file prior to visit.    Review of Systems:  As per HPI- otherwise negative.   Physical Examination: Filed Vitals:   12/27/14 0824  BP: 120/72  Pulse: 83  Temp: 98.2 F (36.8 C)  Resp: 16   Filed Vitals:   12/27/14 0824  Height: 5\' 1"  (1.549 m)  Weight: 113 lb 6.4 oz (51.438 kg)   Body mass index is 21.44 kg/(m^2). Ideal Body Weight: Weight in (lb) to have BMI = 25: 132  GEN: WDWN, NAD, Non-toxic, A & O x 3, looks well HEENT: Atraumatic, Normocephalic. Neck supple.  No masses, No LAD.  Bilateral TM wnl, oropharynx normal.  PEERL,EOMI.   Ears and Nose: No external deformity. CV: RRR, No M/G/R. No JVD. No thrill. No extra heart sounds. PULM: CTA B, no wheezes, crackles, rhonchi. No retractions. No resp. distress. No accessory muscle use. ABD: S, NT, ND, +BS. No rebound. No HSM. EXTR: No c/c/e NEURO Normal gait.  PSYCH: Normally interactive. Conversant. Not depressed or anxious appearing.  Calm demeanor.  Breast: normal exam, no masses/ dimpling/ discharge Pelvic: normal, no vaginal lesions or discharge. Uterus normal, no CMT, no adnexal tendereness or masses  CXR 12/7: CHEST 2 VIEW  COMPARISON: None.  FINDINGS: The heart size and mediastinal contours are within normal limits. There is no focal infiltrate, pulmonary edema, or pleural effusion. The lungs are hyperinflated The visualized skeletal  structures are unremarkable.  IMPRESSION: No active cardiopulmonary disease. Hyperinflated lungs.  Suspect that she has COPD.  She does not wish to add a separate visit today so will defer spirometry  Assessment and Plan: Physical exam  Screening for cervical cancer - Plan: Pap IG and HPV (high risk) DNA detection  Essential hypertension - Plan: Comprehensive metabolic panel, Lipid panel, TSH  Osteopenia - Plan: CBC, Vitamin D, 25-hydroxy, DG Bone Density  Screening for hyperlipidemia - Plan: Lipid panel  Tobacco use disorder  Disorder of bone - Plan: Vitamin D, 25-hydroxy  Screening for condition  Cough - Plan: predniSONE (DELTASONE) 20 MG tablet  Screening for breast cancer - Plan: MM Digital Screening  Anita Calderon has gotten behind on her health maintenance.  Pap, labs and rx for zostavax given today Will refer her for a mammogram and dexa scan. Encouraged colonoscopy.   Will try a short course of prednisone for likely COPD associated cough.  She will plan to check back in one month and we will do spirometry and discuss inhalers.  Let me know if getting worse.   Defer flu and pneumonia shots today as she is started prednisone today  Signed Lamar Blinks, MD

## 2014-12-27 NOTE — Patient Instructions (Addendum)
Please schedule a colonoscopy;  [facility] can schedule this at your convenience.  Give one of them a call I will set you up for a mammogram and bone density scan  The next time we see you let's do a pneumonia shot and test you for COPD.    I would encourage you to get a flu shot the next time we see you  Please use the prednisone as directed for your cough.  I do suspect that you have COPD due to smoking. Please think about quitting again  I will be in touch with your labs asap

## 2014-12-28 LAB — VITAMIN D 25 HYDROXY (VIT D DEFICIENCY, FRACTURES): Vit D, 25-Hydroxy: 37 ng/mL (ref 30–100)

## 2014-12-29 ENCOUNTER — Encounter: Payer: Self-pay | Admitting: Family Medicine

## 2014-12-29 LAB — PAP IG AND HPV HIGH-RISK: HPV DNA High Risk: NOT DETECTED

## 2015-01-11 ENCOUNTER — Other Ambulatory Visit: Payer: Self-pay | Admitting: Family Medicine

## 2015-01-11 ENCOUNTER — Ambulatory Visit
Admission: RE | Admit: 2015-01-11 | Discharge: 2015-01-11 | Disposition: A | Discharge status: Home / Self Care | Payer: Medicare Other | Source: Ambulatory Visit | Attending: Family Medicine | Admitting: Family Medicine

## 2015-01-11 ENCOUNTER — Ambulatory Visit: Payer: Self-pay

## 2015-01-11 DIAGNOSIS — Z1231 Encounter for screening mammogram for malignant neoplasm of breast: Secondary | ICD-10-CM

## 2015-01-24 ENCOUNTER — Ambulatory Visit (INDEPENDENT_AMBULATORY_CARE_PROVIDER_SITE_OTHER): Payer: Medicare Other | Admitting: Family Medicine

## 2015-01-24 ENCOUNTER — Encounter: Payer: Self-pay | Admitting: Family Medicine

## 2015-01-24 VITALS — BP 150/80 | HR 72 | Temp 97.7°F | Resp 16 | Ht 61.0 in | Wt 117.0 lb

## 2015-01-24 DIAGNOSIS — I10 Essential (primary) hypertension: Secondary | ICD-10-CM | POA: Diagnosis not present

## 2015-01-24 DIAGNOSIS — Z87891 Personal history of nicotine dependence: Secondary | ICD-10-CM

## 2015-01-24 DIAGNOSIS — Z720 Tobacco use: Secondary | ICD-10-CM

## 2015-01-24 DIAGNOSIS — Z23 Encounter for immunization: Secondary | ICD-10-CM

## 2015-01-24 MED ORDER — LOSARTAN POTASSIUM 50 MG PO TABS: 100.0000 mg | ORAL_TABLET | Freq: Every day | ORAL | Status: DC

## 2015-01-24 NOTE — Patient Instructions (Addendum)
[facility]- please call them if you do not hear about your mammogram in the next couple of weeks  Address: [address], [place], [place] [address] [phone]  At this time your breathing test looks good.  Please do work on quitting smoking again to help prevent COPD If your breathing is bothering you again let me know  We are going to increase your dose of losartan for your blood pressure- take 2 pills (100mg ) daily but if You notice that your blood pressure is lower than 120/75 please call me and go back to 50 mg  Please come and see me in about 6 months.  However if you would please update me about your blood pressure in a few weeks I would appreciate it.

## 2015-01-24 NOTE — Progress Notes (Signed)
[facility] [address], [place] [address] [phone]  Date:  01/24/2015   Name:  Anita Calderon   DOB:  1951/05/31   MRN:  [id number]  PCP:  No primary care provider on file.    Chief Complaint: Follow-up and Annual Exam   History of Present Illness:  Anita RYBACKI is a 64 y.o. very pleasant female patient who presents with the following:  Seen about a month ago for a CPE and suspicion onf COPD.  She is here today for spirometry.  We did not do her flu shot last time as we placed her on prednisone.  She did feel that the prednisone helped her.  She had been also using some benadryl at night for PND which also helped.   She would like to have her flu shot and prevnar today  Her BP at home tends to be about 150/90 She is a current smoker- she quit for a while but started back up after her husband died in 05-13-14 She is able to be as active as she wants to be and does not feel that her breathing holds her back at all.    She is currently on 50 mg of cozaar  She had a mammogram a couple of weeks ago and wonders about the result.    BP Readings from Last 3 Encounters:  01/24/15 160/70  12/27/14 120/72  12/06/14 146/66     Patient Active Problem List   Diagnosis Date Noted  . HTN (hypertension) 02/04/2012  . Osteopenia 02/04/2012  . Health care maintenance 02/04/2012  . SMOKER 02/12/2011  . HEMOPTYSIS UNSPECIFIED 02/12/2011    Past Medical History  Diagnosis Date  . Herpes genitalis in women 2005  . Hyperlipidemia   . Menopause   . Hypertension   . GERD (gastroesophageal reflux disease)   . Osteopenia   . COPD (chronic obstructive pulmonary disease)   . Heart murmur     Past Surgical History  Procedure Laterality Date  . Ganglion cyst excision  1970  . Parotid gland tumor excision  2005    History  Substance Use Topics  . Smoking status: Current Every Day Smoker  . Smokeless tobacco: Not on file  . Alcohol Use: Not on file     Family History  Problem Relation Age of Onset  . Hypertension Father   . Heart disease Father   . Brain cancer Mother   . Cancer Brother     Allergies  Allergen Reactions  . Ciprofloxacin     REACTION: hives    Medication list has been reviewed and updated.  Current Outpatient Prescriptions on File Prior to Visit  Medication Sig Dispense Refill  . losartan (COZAAR) 50 MG tablet Take 1 tablet (50 mg total) by mouth daily. 30 tablet 5   No current facility-administered medications on file prior to visit.    Review of Systems:  As per HPI- otherwise negative.   Physical Examination: Filed Vitals:   01/24/15 1009  BP: 160/70  Pulse: 72  Temp: 97.7 F (36.5 C)  Resp: 16   Filed Vitals:   01/24/15 1009  Height: 5\' 1"  (1.549 m)  Weight: 117 lb (53.071 kg)   Body mass index is 22.12 kg/(m^2). Ideal Body Weight: Weight in (lb) to have BMI = 25: 132  GEN: WDWN, NAD, Non-toxic, A & O x 3, looks well HEENT: Atraumatic, Normocephalic. Neck supple. No masses, No LAD.  Bilateral TM wnl, oropharynx normal.  PEERL,EOMI.   Ears and Nose: No external deformity. CV: RRR, No M/G/R. No JVD. No thrill. No extra heart sounds. PULM: CTA B, no wheezes, crackles, rhonchi. No retractions. No resp. distress. No accessory muscle use. EXTR: No c/c/e NEURO Normal gait.  PSYCH: Normally interactive. Conversant. Not depressed or anxious appearing.  Calm demeanor.   Normal spirometry today- no evidence of COPD.    Assessment and Plan: History of smoking - Plan: Spirometry with graph  Need for prophylactic vaccination and inoculation against influenza - Plan: Flu Vaccine QUAD 36+ mos IM  Immunization due - Plan: Pneumococcal conjugate vaccine 13-valent IM  Essential hypertension - Plan: losartan (COZAAR) 50 MG tablet  At this time she does not appear to have COPD.  However encouraged her to quit smoking to help prevent the development of COPD Flu shot and prevnar today Her BP is  not fully controled; she will go up to 100mg  of cozaar and will keep me posted about her BP Advised that her mammogram does not appear to have been read yet See patient instructions for more details.     Signed Lamar Blinks, MD

## 2015-02-03 ENCOUNTER — Telehealth: Payer: Self-pay

## 2015-02-03 NOTE — Telephone Encounter (Signed)
Patient wanted to let Dr Lorelei Pont know her BP reading this morning was 124/71. Patients call back number is [phone]

## 2015-02-15 ENCOUNTER — Encounter: Payer: Self-pay | Admitting: Family Medicine

## 2015-07-25 ENCOUNTER — Encounter: Payer: Self-pay | Admitting: Family Medicine

## 2015-07-25 ENCOUNTER — Ambulatory Visit (INDEPENDENT_AMBULATORY_CARE_PROVIDER_SITE_OTHER): Payer: Medicare Other | Admitting: Family Medicine

## 2015-07-25 VITALS — BP 140/77 | HR 80 | Temp 97.9°F | Resp 16 | Ht 61.0 in | Wt 116.0 lb

## 2015-07-25 DIAGNOSIS — I10 Essential (primary) hypertension: Secondary | ICD-10-CM

## 2015-07-25 DIAGNOSIS — Z720 Tobacco use: Secondary | ICD-10-CM

## 2015-07-25 DIAGNOSIS — Z1211 Encounter for screening for malignant neoplasm of colon: Secondary | ICD-10-CM

## 2015-07-25 DIAGNOSIS — F17200 Nicotine dependence, unspecified, uncomplicated: Secondary | ICD-10-CM

## 2015-07-25 MED ORDER — LOSARTAN POTASSIUM 50 MG PO TABS: 100.0000 mg | ORAL_TABLET | Freq: Every day | ORAL | Status: DC

## 2015-07-25 NOTE — Patient Instructions (Signed)
Please see me for a recheck and fasting labs in about 6 months Please schedule a colonoscopy- [facility] Phone: [phone]  Try taking 50 mg of losartan twice a day- I would like to see your BP around 130 for the top number However if this makes you go too low you can back down again to 75 mg total

## 2015-07-25 NOTE — Progress Notes (Signed)
[facility] [address] [place] [address] [phone]  Date:  07/25/2015   Name:  Anita Calderon   DOB:  1951/02/28   MRN:  [id number]  PCP:  No primary care provider on file.    Chief Complaint: Hypertension and Medication Refill   History of Present Illness:  Anita Calderon is a 64 y.o. very pleasant female patient who presents with the following:  Here today to recheck her HTN.  She is taking losartan 50- 1.5 daily.  She tried taking 100 mg but this made her BP too low in the past.  She does check her home BP- she may run 150/70s at home, sometimes better.   She continues to work on quitting smoking- she is at least cutting back.   She has not yet done her colonoscopy- plans to do this in the fall BP Readings from Last 3 Encounters:  07/25/15 151/77  01/24/15 150/80  12/27/14 120/72     Patient Active Problem List   Diagnosis Date Noted  . HTN (hypertension) 02/04/2012  . Osteopenia 02/04/2012  . Health care maintenance 02/04/2012  . SMOKER 02/12/2011  . HEMOPTYSIS UNSPECIFIED 02/12/2011    Past Medical History  Diagnosis Date  . Herpes genitalis in women 2005  . Hyperlipidemia   . Menopause   . Hypertension   . GERD (gastroesophageal reflux disease)   . Osteopenia   . COPD (chronic obstructive pulmonary disease)   . Heart murmur     Past Surgical History  Procedure Laterality Date  . Ganglion cyst excision  1970  . Parotid gland tumor excision  2005    History  Substance Use Topics  . Smoking status: Current Every Day Smoker  . Smokeless tobacco: Not on file  . Alcohol Use: 4.2 oz/week    7 Glasses of wine per week    Family History  Problem Relation Age of Onset  . Hypertension Father   . Heart disease Father   . Brain cancer Mother   . Cancer Brother     Allergies  Allergen Reactions  . Ciprofloxacin     REACTION: hives    Medication list has been reviewed and updated.  Current Outpatient Prescriptions on File  Prior to Visit  Medication Sig Dispense Refill  . losartan (COZAAR) 50 MG tablet Take 2 tablets (100 mg total) by mouth daily. Take 1 tablet if your blood pressure is too low with 100 mg 60 tablet 5   No current facility-administered medications on file prior to visit.    Review of Systems:  As per HPI- otherwise negative.   Physical Examination: Filed Vitals:   07/25/15 0947  BP: 151/77  Pulse: 80  Temp: 97.9 F (36.6 C)  Resp: 16   Filed Vitals:   07/25/15 0947  Height: 5\' 1"  (1.549 m)  Weight: 116 lb (52.617 kg)   Body mass index is 21.93 kg/(m^2). Ideal Body Weight: Weight in (lb) to have BMI = 25: 132  GEN: WDWN, NAD, Non-toxic, A & O x 3 HEENT: Atraumatic, Normocephalic. Neck supple. No masses, No LAD. Ears and Nose: No external deformity. CV: RRR, No M/G/R. No JVD. No thrill. No extra heart sounds. PULM: CTA B, no wheezes, crackles, rhonchi. No retractions. No resp. distress. No accessory muscle use. ABD: S, NT, ND, +BS. No rebound. No HSM. EXTR: No c/c/e NEURO Normal gait.  PSYCH: Normally interactive. Conversant. Not depressed or anxious appearing.  Calm demeanor.    Assessment and  Plan: Essential hypertension - Plan: losartan (COZAAR) 50 MG tablet  Colon cancer screening  Smoking   Defer pneumovax today as she plans to get her zostavax today at drug store Continued to encourage smoking cessation Gave contact info for colonoscopy  Plan recheck for labs/ CPE in 6 months  She will try taking 50mg  of losartan BID- will see if this controls her BP without hypotension  Signed Lamar Blinks, MD

## 2015-08-01 ENCOUNTER — Ambulatory Visit (INDEPENDENT_AMBULATORY_CARE_PROVIDER_SITE_OTHER): Payer: Medicare Other | Admitting: Family Medicine

## 2015-08-01 VITALS — BP 160/82 | HR 80 | Temp 98.8°F | Resp 16 | Ht 61.25 in | Wt 117.0 lb

## 2015-08-01 DIAGNOSIS — Z1329 Encounter for screening for other suspected endocrine disorder: Secondary | ICD-10-CM

## 2015-08-01 DIAGNOSIS — R55 Syncope and collapse: Secondary | ICD-10-CM

## 2015-08-01 DIAGNOSIS — I10 Essential (primary) hypertension: Secondary | ICD-10-CM | POA: Diagnosis not present

## 2015-08-01 DIAGNOSIS — R030 Elevated blood-pressure reading, without diagnosis of hypertension: Secondary | ICD-10-CM

## 2015-08-01 DIAGNOSIS — R0989 Other specified symptoms and signs involving the circulatory and respiratory systems: Secondary | ICD-10-CM

## 2015-08-01 LAB — COMPREHENSIVE METABOLIC PANEL
ALBUMIN: 4.7 g/dL (ref 3.6–5.1)
ALK PHOS: 70 U/L (ref 33–130)
ALT: 12 U/L (ref 6–29)
AST: 19 U/L (ref 10–35)
BILIRUBIN TOTAL: 0.6 mg/dL (ref 0.2–1.2)
BUN: 8 mg/dL (ref 7–25)
CALCIUM: 9.8 mg/dL (ref 8.6–10.4)
CO2: 23 mmol/L (ref 20–31)
Chloride: 96 mmol/L — ABNORMAL LOW (ref 98–110)
Creat: 0.73 mg/dL (ref 0.50–0.99)
Glucose, Bld: 91 mg/dL (ref 65–99)
Potassium: 4.5 mmol/L (ref 3.5–5.3)
Sodium: 134 mmol/L — ABNORMAL LOW (ref 135–146)
TOTAL PROTEIN: 7.6 g/dL (ref 6.1–8.1)

## 2015-08-01 LAB — CBC
HCT: 38.4 % (ref 36.0–46.0)
Hemoglobin: 13.3 g/dL (ref 12.0–15.0)
MCH: 32.8 pg (ref 26.0–34.0)
MCHC: 34.6 g/dL (ref 30.0–36.0)
MCV: 94.8 fL (ref 78.0–100.0)
MPV: 10.2 fL (ref 8.6–12.4)
Platelets: 328 10*3/uL (ref 150–400)
RBC: 4.05 MIL/uL (ref 3.87–5.11)
RDW: 13.6 % (ref 11.5–15.5)
WBC: 9.8 10*3/uL (ref 4.0–10.5)

## 2015-08-01 LAB — TSH: TSH: 2.13 u[IU]/mL (ref 0.350–4.500)

## 2015-08-01 MED ORDER — AMLODIPINE BESYLATE 5 MG PO TABS: 5.0000 mg | ORAL_TABLET | Freq: Every day | ORAL | Status: DC

## 2015-08-01 NOTE — Progress Notes (Addendum)
[facility] [address], [place] [address] [phone]  Date:  08/01/2015   Name:  Anita Calderon   DOB:  [birth year]   MRN:  [id number]  PCP:  No primary care provider on file.    Chief Complaint: Follow-up   History of Present Illness:  Anita Calderon is a 64 y.o. very pleasant female patient who presents with the following:  She was here a week ago for a recheck.  At that time we changed her BP medication to losartan 50 BID from her previous dose of 75 mg daily due to elevated BP.  She has been taking the doses 6 hours apart    She reports that the first days she did this she did well- however the next day she seemed to "colapse."  She walked into her living room and went onto her knees, then "only my face on the carpet."  She rested on the ground for a little less than a minute, got up and she went to bed.  She did not have any CP or SOB, did not actually lose consciousness, no incontince   The next day she noted that her BP was 117/61.  She took just 50 mg of losartan that day The next day also had a low reading and continued 50 mg dose.  She has been taking just 50 mg of losartan for the last several days.  Yesterday she was 145/79, took 1.5 tabs.   She took 75 mg yesterday and today.  BP is high again  She reports that she has felt "tired and weak for a good while, I feel like my mind is fuzzy.  My hands are clammy and cold, my feet are sweating."    She did have an echo several years ago due to her heart murmu- Dr. Einar Gip, 2012.  It was normal  No other history of heart evaluation She did "collapse" one other time a couple of years ago  BP Readings from Last 3 Encounters:  08/01/15 160/82  07/25/15 140/77  01/24/15 150/80     Patient Active Problem List   Diagnosis Date Noted  . HTN (hypertension) 02/04/2012  . Osteopenia 02/04/2012  . Health care maintenance 02/04/2012  . SMOKER 02/12/2011  . HEMOPTYSIS UNSPECIFIED 02/12/2011    Past Medical  History  Diagnosis Date  . Herpes genitalis in women 2005  . Hyperlipidemia   . Menopause   . Hypertension   . GERD (gastroesophageal reflux disease)   . Osteopenia   . COPD (chronic obstructive pulmonary disease)   . Heart murmur     Past Surgical History  Procedure Laterality Date  . Ganglion cyst excision  1970  . Parotid gland tumor excision  2005    History  Substance Use Topics  . Smoking status: Current Every Day Smoker  . Smokeless tobacco: Not on file  . Alcohol Use: 4.2 oz/week    7 Glasses of wine per week    Family History  Problem Relation Age of Onset  . Hypertension Father   . Heart disease Father   . Brain cancer Mother   . Cancer Brother     Allergies  Allergen Reactions  . Ciprofloxacin     REACTION: hives    Medication list has been reviewed and updated.  Current Outpatient Prescriptions on File Prior to Visit  Medication Sig Dispense Refill  . losartan (COZAAR) 50 MG tablet Take 2 tablets (100 mg total) by mouth daily. Take  1 tablet if your blood pressure is too low with 100 mg 180 tablet 3   No current facility-administered medications on file prior to visit.    Review of Systems:  As per HPI- otherwise negative.   Physical Examination: Filed Vitals:   08/01/15 1330  BP: 152/80  Pulse: 80  Temp: 98.8 F (37.1 C)  Resp: 16   Filed Vitals:   08/01/15 1330  Height: 5' 1.25" (1.556 m)  Weight: 117 lb (53.071 kg)   Body mass index is 21.92 kg/(m^2). Ideal Body Weight: Weight in (lb) to have BMI = 25: 133.1  GEN: WDWN, NAD, Non-toxic, A & O x 3, looks well HEENT: Atraumatic, Normocephalic. Neck supple. No masses, No LAD. Ears and Nose: No external deformity. CV: RRR, No M/G/R. No JVD. No thrill. No extra heart sounds. PULM: CTA B, no wheezes, crackles, rhonchi. No retractions. No resp. distress. No accessory muscle use. ABD: S, NT, ND, +BS. No rebound. No HSM. EXTR: No c/c/e NEURO Normal gait.  PSYCH: Normally interactive.  Conversant. Not depressed or anxious appearing.  Calm demeanor.   EKG: NSR, no ST elevation or depression.  It appears very similar to past EKG in paper chart  BP laying 149/84, P 75 Sitting 164/84, P 77 Standing 143/84, P 78  Assessment and Plan: Pre-syncope - Plan: CBC, Comprehensive metabolic panel, TSH, EKG 88-CZYS, Ambulatory referral to Cardiology  Labile blood pressure - Plan: TSH, Ambulatory referral to Cardiology  Screening for hypothyroidism - Plan: TSH  Essential hypertension, benign - Plan: TSH, amLODipine (NORVASC) 5 MG tablet, Ambulatory referral to Cardiology  We increased her losartan a bit a week ago- however, this led to a presyncopal episode a few days ago. She has since decreased her losartan to 50 mg and feels better but her BP is now high.   Will add amlodipine to her regimen- start with 2.5 mg and increase to 5mg  if needed Referral back to cardiology for evaluation Follow-up with her pending labs Signed Lamar Blinks, MD  Gave her a call with labs 8/3.  She reports that she has taken the 2.5 of norvasc just yesterday, but did well so far.  Her labs look good except slightly low of na and cl- she will increase her salt intake a bit,  She sees Dr. Einar Gip later this month.  She will letm ek now if any concerns in the meantime   Results for orders placed or performed in visit on 08/01/15  CBC  Result Value Ref Range   WBC 9.8 4.0 - 10.5 K/uL   RBC 4.05 3.87 - 5.11 MIL/uL   Hemoglobin 13.3 12.0 - 15.0 g/dL   HCT 38.4 36.0 - 46.0 %   MCV 94.8 78.0 - 100.0 fL   MCH 32.8 26.0 - 34.0 pg   MCHC 34.6 30.0 - 36.0 g/dL   RDW 13.6 11.5 - 15.5 %   Platelets 328 150 - 400 K/uL   MPV 10.2 8.6 - 12.4 fL  Comprehensive metabolic panel  Result Value Ref Range   Sodium 134 (L) 135 - 146 mmol/L   Potassium 4.5 3.5 - 5.3 mmol/L   Chloride 96 (L) 98 - 110 mmol/L   CO2 23 20 - 31 mmol/L   Glucose, Bld 91 65 - 99 mg/dL   BUN 8 7 - 25 mg/dL   Creat 0.73 0.50 - 0.99 mg/dL    Total Bilirubin 0.6 0.2 - 1.2 mg/dL   Alkaline Phosphatase 70 33 - 130 U/L   AST  19 10 - 35 U/L   ALT 12 6 - 29 U/L   Total Protein 7.6 6.1 - 8.1 g/dL   Albumin 4.7 3.6 - 5.1 g/dL   Calcium 9.8 8.6 - 10.4 mg/dL  TSH  Result Value Ref Range   TSH 2.130 0.350 - 4.500 uIU/mL

## 2015-08-01 NOTE — Patient Instructions (Signed)
Your EKG looks ok today, but I am going to have Dr. Einar Gip take a look at you again We will be in touch with an appointment Stick with 50 mg of losartan in the am Add a 1/2 tablet (2.5 mg) of amlodipine in the afternoon If your BP is running 150 or above after a couple of days, go to a whole pill  Please keep me posted especially if you have any more episodes

## 2015-09-14 ENCOUNTER — Encounter: Payer: Self-pay | Admitting: Family Medicine

## 2015-09-14 DIAGNOSIS — R011 Cardiac murmur, unspecified: Secondary | ICD-10-CM | POA: Insufficient documentation

## 2015-09-21 LAB — HM DIABETES EYE EXAM

## 2016-01-01 HISTORY — PX: POLYPECTOMY: SHX149

## 2016-01-01 HISTORY — PX: COLONOSCOPY: SHX174

## 2016-01-09 ENCOUNTER — Encounter: Payer: Medicare Other | Admitting: Family Medicine

## 2016-01-11 ENCOUNTER — Encounter: Payer: Medicare Other | Admitting: Family Medicine

## 2016-01-24 ENCOUNTER — Telehealth: Payer: Self-pay | Admitting: Family Medicine

## 2016-01-24 DIAGNOSIS — Z1211 Encounter for screening for malignant neoplasm of colon: Secondary | ICD-10-CM

## 2016-01-24 NOTE — Telephone Encounter (Signed)
Spoke with patient about her colonoscopy.  She is allowing me to schedule it with [facility].  She also stated that she had her eye exam with [facility]?  I will see if they will fax over her report.  i left a message with [facility] to send [place] a copy of the eye exam

## 2016-01-25 ENCOUNTER — Encounter: Payer: Self-pay | Admitting: Family Medicine

## 2016-02-01 ENCOUNTER — Encounter: Payer: Self-pay | Admitting: Family Medicine

## 2016-02-01 ENCOUNTER — Ambulatory Visit (INDEPENDENT_AMBULATORY_CARE_PROVIDER_SITE_OTHER): Payer: Medicare Other | Admitting: Family Medicine

## 2016-02-01 VITALS — BP 130/70 | HR 87 | Temp 98.1°F | Resp 16 | Ht 60.75 in | Wt 119.4 lb

## 2016-02-01 DIAGNOSIS — Z23 Encounter for immunization: Secondary | ICD-10-CM | POA: Diagnosis not present

## 2016-02-01 DIAGNOSIS — I10 Essential (primary) hypertension: Secondary | ICD-10-CM | POA: Diagnosis not present

## 2016-02-01 DIAGNOSIS — Z0000 Encounter for general adult medical examination without abnormal findings: Secondary | ICD-10-CM

## 2016-02-01 MED ORDER — VALSARTAN 320 MG PO TABS: 320.0000 mg | ORAL_TABLET | Freq: Every day | ORAL | Status: DC

## 2016-02-01 MED ORDER — MINOXIDIL 10 MG PO TABS: ORAL_TABLET | ORAL | Status: DC

## 2016-02-01 NOTE — Patient Instructions (Signed)
It was good to see you today!  We do not have to do any labs for you today as you are caught up Please let me know if you have any concerns If you prefer to wait until next year for your mammogram that is ok! We gave you a flu shot and your second (and last!) pneumonia vaccine today.  Please continue to work on cutting down/ quitting smoking

## 2016-02-01 NOTE — Progress Notes (Signed)
[facility] [address] [place] [address] [phone]  Date:  02/01/2016   Name:  Anita Calderon   DOB:  [birth year]   MRN:  [id number]  PCP:  No primary care provider on file.    Chief Complaint: Annual Exam; Medication Refill; and lab results from Dr, Irven Shelling of[facility]   History of Present Illness:  Anita Calderon is a 65 y.o. very pleasant female patient who presents with the following:  Here today for a PE and medication review History of HTN, she is still a smoker but has cut back to a 1/2 pack.  She hopes to continue to cut down Flu shot  Due for pneumovax today tdap and zostavax done  Pap in 2015 was negative, neg for HRHPV also  She has a colonoscopy scheduled this month  mammo is UTD  She was having some trouble with pre-syncope a few months ago.  She has undergone extensive eval per Dr. Einar Gip No recent falls.  Her BP has been looking good at home per her report She is now on valsartan, minoxidil and lipitor- no other meds except asa  She has no particular concerns today She recently had a CMP and lipids today and bring a copy with her  Wt Readings from Last 3 Encounters:  02/01/16 119 lb 6.4 oz (54.159 kg)  08/01/15 117 lb (53.071 kg)  07/25/15 116 lb (52.617 kg)      Patient Active Problem List   Diagnosis Date Noted  . Heart murmur 09/14/2015  . HTN (hypertension) 02/04/2012  . Osteopenia 02/04/2012  . Health care maintenance 02/04/2012  . SMOKER 02/12/2011  . HEMOPTYSIS UNSPECIFIED 02/12/2011    Past Medical History  Diagnosis Date  . Herpes genitalis in women 2005  . Hyperlipidemia   . Menopause   . Hypertension   . GERD (gastroesophageal reflux disease)   . Osteopenia   . COPD (chronic obstructive pulmonary disease) ([place])   . Heart murmur     Past Surgical History  Procedure Laterality Date  . Ganglion cyst excision  1970  . Parotid gland tumor excision  2005    Social History  Substance Use Topics  . Smoking status:  Current Every Day Smoker  . Smokeless tobacco: Not on file  . Alcohol Use: 1.2 oz/week    2 Glasses of wine per week     Comment: 2 glasses of wine daily    Family History  Problem Relation Age of Onset  . Hypertension Father   . Heart disease Father   . Brain cancer Mother   . Cancer Brother   . Hypertension Brother   . Hypertension Maternal Grandfather   . Heart disease Paternal Grandfather     Allergies  Allergen Reactions  . Ciprofloxacin     REACTION: hives    Medication list has been reviewed and updated.  Current Outpatient Prescriptions on File Prior to Visit  Medication Sig Dispense Refill  . amLODipine (NORVASC) 5 MG tablet Take 1 tablet (5 mg total) by mouth daily. (Patient not taking: Reported on 02/01/2016) 90 tablet 3  . losartan (COZAAR) 50 MG tablet Take 2 tablets (100 mg total) by mouth daily. Take 1 tablet if your blood pressure is too low with 100 mg (Patient not taking: Reported on 02/01/2016) 180 tablet 3   No current facility-administered medications on file prior to visit.    Review of Systems:  As per HPI- otherwise negative.   Physical Examination: Filed Vitals:  02/01/16 1126  BP: 130/70  Pulse: 87  Temp: 98.1 F (36.7 C)  Resp: 16   Filed Vitals:   02/01/16 1126  Height: 5' 0.75" (1.543 m)  Weight: 119 lb 6.4 oz (54.159 kg)   Body mass index is 22.75 kg/(m^2). Ideal Body Weight: Weight in (lb) to have BMI = 25: 131  GEN: WDWN, NAD, Non-toxic, A & O x 3, looks well, normal weight HEENT: Atraumatic, Normocephalic. Neck supple. No masses, No LAD.  Bilateral TM wnl, oropharynx normal.  PEERL,EOMI.   Ears and Nose: No external deformity. CV: RRR, No M/G/R. No JVD. No thrill. No extra heart sounds. PULM: CTA B, no wheezes, crackles, rhonchi. No retractions. No resp. distress. No accessory muscle use. ABD: S, NT, ND. No rebound. No HSM. EXTR: No c/c/e NEURO Normal gait.  PSYCH: Normally interactive. Conversant. Not depressed or anxious  appearing.  Calm demeanor.  Breast: normal exam, no masses/ dimpling/ discharge   Assessment and Plan: Physical exam  Immunization due - Plan: Flu Vaccine QUAD 36+ mos IM, Pneumococcal polysaccharide vaccine 23-valent greater than or equal to 2yo subcutaneous/IM  Essential hypertension - Plan: valsartan (DIOVAN) 320 MG tablet, minoxidil (LONITEN) 10 MG tablet  Her BP looks fine today Gave pneumovax and flu shots, refilled her medications as needed She plans to transfer care to one of my partners as [facility] is very convenient for her.  She will follow-up as per her usual routine Signed Lamar Blinks, MD

## 2016-02-02 ENCOUNTER — Ambulatory Visit (AMBULATORY_SURGERY_CENTER): Payer: Self-pay

## 2016-02-02 VITALS — Ht 61.0 in | Wt 123.0 lb

## 2016-02-02 DIAGNOSIS — Z1211 Encounter for screening for malignant neoplasm of colon: Secondary | ICD-10-CM

## 2016-02-02 MED ORDER — SUPREP BOWEL PREP KIT 17.5-3.13-1.6 GM/177ML PO SOLN: 1.0000 | Freq: Once | ORAL | Status: DC

## 2016-02-02 NOTE — Progress Notes (Signed)
No allergies to eggs or soy No past problems with anesthesia No diet/weight loss meds No home oxygen  Has email and internet; registered for emmi

## 2016-02-16 ENCOUNTER — Ambulatory Visit (AMBULATORY_SURGERY_CENTER): Payer: Medicare Other | Admitting: Internal Medicine

## 2016-02-16 ENCOUNTER — Encounter: Payer: Self-pay | Admitting: Internal Medicine

## 2016-02-16 VITALS — BP 93/53 | HR 105 | Temp 97.5°F | Resp 15 | Ht 61.0 in | Wt 123.0 lb

## 2016-02-16 DIAGNOSIS — Z1211 Encounter for screening for malignant neoplasm of colon: Secondary | ICD-10-CM

## 2016-02-16 DIAGNOSIS — D123 Benign neoplasm of transverse colon: Secondary | ICD-10-CM

## 2016-02-16 DIAGNOSIS — D122 Benign neoplasm of ascending colon: Secondary | ICD-10-CM

## 2016-02-16 MED ORDER — SODIUM CHLORIDE 0.9 % IV SOLN: 500.0000 mL | INTRAVENOUS | Status: DC

## 2016-02-16 NOTE — Progress Notes (Signed)
Report to PACU, RN, vss, BBS= Clear.  

## 2016-02-16 NOTE — Patient Instructions (Signed)

## 2016-02-16 NOTE — Progress Notes (Signed)
Called to room to assist during endoscopic procedure.  Patient ID and intended procedure confirmed with present staff. Received instructions for my participation in the procedure from the performing physician.  

## 2016-02-16 NOTE — Op Note (Signed)
[place]  [address]. [place], [address]   COLONOSCOPY PROCEDURE REPORT  PATIENT: Anita Calderon, Anita Calderon  MR#: [id number] BIRTHDATE: 07/14/1951 , 27  yrs. old GENDER: female ENDOSCOPIST: Eustace Quail, MD REFERRED [id number] [place], M.D. PROCEDURE DATE:  02/16/2016 PROCEDURE:   Colonoscopy, screening, Colonoscopy with snare polypectomy, and Submucosal injection, any substance First Screening Colonoscopy - Avg.  risk and is 50 yrs.  old or older Yes.  Prior Negative Screening - Now for repeat screening. N/A  History of Adenoma - Now for follow-up colonoscopy & has been > or = to 3 yrs.  N/A  Polyps removed today? Yes ASA CLASS:   Class II INDICATIONS:Screening for colonic neoplasia and Colorectal Neoplasm Risk Assessment for this procedure is average risk. MEDICATIONS: Monitored anesthesia care and Propofol 400 mg IV  DESCRIPTION OF PROCEDURE:   After the risks benefits and alternatives of the procedure were thoroughly explained, informed consent was obtained.  The digital rectal exam revealed no abnormalities of the rectum.   The [id number]  endoscope was introduced through the anus and advanced to the cecum, which was identified by both the appendix and ileocecal valve. No adverse events experienced.   The quality of the prep was good.  (Suprep was used)  The instrument was then slowly withdrawn as the colon was fully examined. Estimated blood loss is zero unless otherwise noted in this procedure report.  COLON FINDINGS: A single polyp measuring 20 mm in size was found in the ascending colon.  A polypectomy was performed with hot snare after submucosal injection of normal saline raise and the entire lesion. Resection was complete and in one piece.   Two polyps measuring 3 mm in size were found in the transverse colon.  A polypectomy was performed with a cold snare.  The resection was complete. All polypoid tissue was completely retrieved and sent  to histology.   There was mild diverticulosis noted in the sigmoid colon.   The examination was otherwise normal.  Retroflexed views revealed no abnormalities. The time to cecum = 5.4 Withdrawal time = 15.6   The scope was withdrawn and the procedure completed. COMPLICATIONS: There were no immediate complications.  ENDOSCOPIC IMPRESSION: 1.   Single polyp was found in the ascending colon; snare cautery polypectomy was performed after saline lift 2.   Two polyps were found in the transverse colon; polypectomy was performed with a cold snare 3.   Mild diverticulosis was noted in the sigmoid colon 4.   The examination was otherwise normal  RECOMMENDATIONS: 1. Repeat Colonoscopy in 3 years, if no cancer on large lesion.  eSigned:  Eustace Quail, MD 02/16/2016 12:40 PM   cc: The Patient and Lamar Blinks, MD

## 2016-02-17 ENCOUNTER — Telehealth: Payer: Self-pay

## 2016-02-17 NOTE — Telephone Encounter (Signed)
  Follow up Call-  Call back number 02/16/2016  Post procedure Call Back phone  # [phone]  Permission to leave phone message Yes     Patient questions:  Do you have a fever, pain , or abdominal swelling? No. Pain Score  0 *  Have you tolerated food without any problems? Yes.    Have you been able to return to your normal activities? Yes.    Do you have any questions about your discharge instructions: Diet   No. Medications  No. Follow up visit  No.  Do you have questions or concerns about your Care? No.  Actions: * If pain score is 4 or above: No action needed, pain <4.

## 2016-02-21 ENCOUNTER — Encounter: Payer: Self-pay | Admitting: Internal Medicine

## 2016-05-14 ENCOUNTER — Other Ambulatory Visit: Payer: Self-pay | Admitting: Family Medicine

## 2017-04-23 NOTE — Progress Notes (Signed)
QUICK REFERENCE INFORMATION: The ABCs of Providing the Annual Wellness Visit  CMS.gov Medicare Learning Network  [employer] Annual Wellness Visit  Subjective:   Anita Calderon is a 66 y.o. Female who presents for an Annual Wellness Visit.  Hypertension: Patient here for follow-up of elevated blood pressure. She is not exercising and is not adherent to low salt diet.  Blood pressure is well controlled at home. Cardiac symptoms none. Patient denies chest pain, chest pressure/discomfort, claudication, dyspnea, exertional chest pressure/discomfort, irregular heart beat and lower extremity edema.  Cardiovascular risk factors: advanced age (older than 42 for men, 45 for women), hypertension and smoking/ tobacco exposure.  BP Readings from Last 3 Encounters:  04/24/17 138/64  02/16/16 (!) 93/53  02/01/16 130/70    Smoking 48 pack year use Quit for 2 years in the past Does not currently have the motivation to quit She would considering hearing more about her options though She denies hemotpysis, cough, wheezing  Patient Active Problem List   Diagnosis Date Noted  . Heart murmur 09/14/2015  . HTN (hypertension) 02/04/2012  . Osteopenia 02/04/2012  . Health care maintenance 02/04/2012  . SMOKER 02/12/2011  . HEMOPTYSIS UNSPECIFIED 02/12/2011    Past Medical History:  Diagnosis Date  . GERD (gastroesophageal reflux disease)   . Heart murmur   . Herpes genitalis in women 2005  . Hyperlipidemia   . Hypertension   . Menopause   . Osteopenia      Past Surgical History:  Procedure Laterality Date  . GANGLION CYST EXCISION  1970  . PAROTID GLAND TUMOR EXCISION  2005     Outpatient Medications Prior to Visit  Medication Sig Dispense Refill  . valsartan (DIOVAN) 320 MG tablet Take 1 tablet (320 mg total) by mouth daily. 90 tablet 3  . aspirin 81 MG tablet Take 81 mg by mouth daily.    Marland Kitchen atorvastatin (LIPITOR) 10 MG tablet take 1 tablet by mouth once daily (Patient not taking: Reported  on 04/24/2017) 30 tablet 3  . minoxidil (LONITEN) 10 MG tablet Take a 1/2 tablet twice a day (Patient not taking: Reported on 04/24/2017) 90 tablet 3   No facility-administered medications prior to visit.     Allergies  Allergen Reactions  . Ciprofloxacin     REACTION: hives     Family History  Problem Relation Age of Onset  . Hypertension Father   . Heart disease Father   . Brain cancer Mother   . Cancer Brother   . Hypertension Brother   . Hypertension Maternal Grandfather   . Heart disease Paternal Grandfather   . Colon cancer Neg Hx   . Diabetes Neg Hx   . Stomach cancer Neg Hx   . Rectal cancer Neg Hx      Social History   Social History  . Marital status: Married    Spouse name: N/A  . Number of children: N/A  . Years of education: N/A   Social History Main Topics  . Smoking status: Current Every Day Smoker    Packs/day: 0.50  . Smokeless tobacco: Never Used  . Alcohol use 1.2 oz/week    2 Glasses of wine per week     Comment: 2 glasses of wine daily  . Drug use: No  . Sexual activity: No   Other Topics Concern  . None   Social History Narrative   Widow. Education: [employer]. Exercise: No.      Recent Hospitalizations? no  Health Habits: Current exercise activities include: none  Exercise: 0 times/week. Diet: in general, a "healthy" diet    Alcohol intake: none  Health Risk Assessment: The patient has completed a Health Risk Assessment. This has been reveiwed with them and has been scanned into the [place] system as an attached document.  Current Medical Providers and Suppliers: [facility] Patient Care Team: Forrest Moron, MD as PCP - General (Internal Medicine) No future appointments.   Age-appropriate Screening Schedule: The list below includes current immunization status and future screening recommendations based on patient's age. Orders for these recommended tests are listed in the plan section. The patient has been provided with a  written plan. Immunization History  Administered Date(s) Administered  . Influenza,inj,Quad PF,36+ Mos 12/06/2014, 01/24/2015, 02/01/2016  . Pneumococcal Conjugate-13 01/24/2015  . Pneumococcal Polysaccharide-23 02/01/2016  . Tdap 07/11/2006, 04/24/2017  . Zoster 07/28/2015    Health Maintenance reviewed - see orders  Depression Screen-PHQ2/9 completed today  Depression screen [facility] 2/9 04/24/2017 02/01/2016 07/25/2015 12/27/2014 04/05/2014  Decreased Interest 0 0 0 0 0  Down, Depressed, Hopeless 0 0 0 0 0  PHQ - 2 Score 0 0 0 0 0      Depression Severity and Treatment Recommendations:  0-4= None  5-9= Mild / Treatment: Support, educate to call if worse; return in one month  10-14= Moderate / Treatment: Support, watchful waiting; Antidepressant or Psycotherapy  15-19= Moderately severe / Treatment: Antidepressant OR Psychotherapy  >= 20 = Major depression, severe / Antidepressant AND Psychotherapy  Functional Status Survey: Functional Status Survey: Is the patient deaf or have difficulty hearing?: No (states its getting a little worse) Does the patient have difficulty seeing, even when wearing glasses/contacts?: No (had an visit back in november with no issues) Does the patient have difficulty concentrating, remembering, or making decisions?: No Does the patient have difficulty walking or climbing stairs?: No Does the patient have difficulty dressing or bathing?: No Does the patient have difficulty doing errands alone such as visiting a doctor's office or shopping?: No  Hearing Evaluation: 1. Do you have trouble hearing the television when others do not? no 2. Do you have to strain to hear/understand conversations? no   Advanced Care Planning: 1. Patient has executed an Advance Directive: no  2. If no, patient was given the opportunity to execute an Advance Directive today? yes  3. Are the patient's advanced directives in Epic? no  Cognitive Assessment: Does the patient have  evidence of cognitive impairment? no The patient does not have any evidence of any cognitive problems and denies any  change in mood/affect, appearance, speech, memory or motor skills.  Review of Systems  Constitutional: Negative for chills, fever and weight loss.  HENT: Negative for hearing loss and tinnitus.   Eyes: Negative for blurred vision, double vision and photophobia.  Respiratory: Negative for cough, shortness of breath and wheezing.   Cardiovascular: Positive for leg swelling. Negative for chest pain, palpitations and orthopnea.  Gastrointestinal: Negative for abdominal pain, diarrhea, nausea and vomiting.  Genitourinary: Negative for dysuria, frequency and hematuria.  Musculoskeletal: Negative for myalgias and neck pain.  Skin: Negative for itching and rash.  Neurological: Negative for dizziness, tingling, tremors and headaches.  Psychiatric/Behavioral: Negative for depression and hallucinations. The patient is not nervous/anxious.     Objective:   Vitals:   04/24/17 0824 04/24/17 0911  BP: (!) 171/73 138/64  Pulse: 73   Resp: 16   Temp: 97.5 F (36.4 C)   TempSrc: Oral   SpO2: 99%   Weight: 121 lb (54.9  kg)   Height: 5\' 1"  (1.549 m)     Body mass index is 22.86 kg/m.   Physical Exam  Constitutional: She is oriented to person, place, and time. She appears well-developed and well-nourished.  HENT:  Head: Normocephalic and atraumatic.  Right Ear: External ear normal.  Left Ear: External ear normal.  Nose: Nose normal.  Mouth/Throat: Oropharynx is clear and moist.  Eyes: Conjunctivae and EOM are normal.  Neck: Normal range of motion. Neck supple. No thyromegaly present.  Cardiovascular: Normal rate, regular rhythm and normal heart sounds.   Pulmonary/Chest: Effort normal and breath sounds normal. No respiratory distress. She has no wheezes.  Musculoskeletal: Normal range of motion. She exhibits no edema.  Neurological: She is alert and oriented to person,  place, and time. She has normal reflexes.  Psychiatric: She has a normal mood and affect. Her behavior is normal. Judgment and thought content normal.     Assessment/Plan:   Patient Self-Management and Personalized Health Advice The patient has been provided with information about: exercise, bone density  During the course of the visit the patient was educated and counseled about appropriate screening and preventive services including:  Ordered Bone Density Mammogram recommended Smoking cessation tdap  Body mass index is 22.86 kg/m. Discussed the patient's BMI with her. The BMI BMI is in the acceptable range  Javonda was seen today for annual exam.  Diagnoses and all orders for this visit:  Medicare annual wellness visit, subsequent -     Comprehensive metabolic panel  Essential hypertension- discussed meds and will check for renal function -     Microalbumin, urine -     Comprehensive metabolic panel -     EKG 27-OZDG -     Lipid panel -     valsartan (DIOVAN) 320 MG tablet; Take 1 tablet (320 mg total) by mouth daily.  SMOKER- discussed risk factors from smoking Sent in chantix Will screen for osteoporosis in this patient   Need for Tdap vaccination -     Tdap vaccine greater than or equal to 7yo IM  Menopause- advised patient to follow up for bone density screenings -  Postmenopausal Caucasian female smoker   Other orders -     fluticasone (FLONASE) 50 MCG/ACT nasal spray; Place 2 sprays into both nostrils daily. -     Cetirizine HCl (ZYRTEC ALLERGY) 10 MG CAPS; Take 1 capsule (10 mg total) by mouth daily as needed. -     varenicline (CHANTIX CONTINUING MONTH PAK) 1 MG tablet; Take 1/2 tab for  Week 1, then one tab daily for week 2, then one tab twice a day each day after      No Follow-up on file.  No future appointments.  Patient Instructions       IF you received an x-ray today, you will receive an invoice from [facility]. Please contact  [facility]iology at [phone] with questions or concerns regarding your invoice.   IF you received labwork today, you will receive an invoice from [facility]. Please contact [facility] at [phone] with questions or concerns regarding your invoice.   Our billing staff will not be able to assist you with questions regarding bills from these companies.  You will be contacted with the lab results as soon as they are available. The fastest way to get your results is to activate your My Chart account. Instructions are located on the last page of this paperwork. If you have not heard from [place] regarding the results in 2  weeks, please contact this office.     We recommend that you schedule a mammogram for breast cancer screening. Typically, you do not need a referral to do this. Please contact a local imaging center to schedule your mammogram.  [facility] - [phone]  *ask for the Radiology Department [facility] ([place]) - [phone] or [phone]  [facility] - [phone] [place] [phone] [facility] - [phone]  *ask for the [facility] - [phone]  *ask for the Radiology Department [facility] - [phone]  *ask for the [facility] - [phone]   Steps to Quit Smoking Smoking tobacco can be bad for your health. It can also affect almost every organ in your body. Smoking puts you and people around you at risk for many serious long-lasting (chronic) diseases. Quitting smoking is hard, but it is one of the best things that you can do for your health. It is never too late to quit. What are the benefits of quitting smoking? When you quit smoking, you lower your risk for getting serious diseases and conditions. They can include:  Lung cancer or lung disease.  Heart disease.  Stroke.  Heart attack.  Not being  able to have children (infertility).  Weak bones (osteoporosis) and broken bones (fractures). If you have coughing, wheezing, and shortness of breath, those symptoms may get better when you quit. You may also get sick less often. If you are pregnant, quitting smoking can help to lower your chances of having a baby of low birth weight. What can I do to help me quit smoking? Talk with your doctor about what can help you quit smoking. Some things you can do (strategies) include:  Quitting smoking totally, instead of slowly cutting back how much you smoke over a period of time.  Going to in-person counseling. You are more likely to quit if you go to many counseling sessions.  Using resources and support systems, such as:  Online chats with a Social worker.  Phone quitlines.  Printed Furniture conservator/restorer.  Support groups or group counseling.  Text messaging programs.  Mobile phone apps or applications.  Taking medicines. Some of these medicines may have nicotine in them. If you are pregnant or breastfeeding, do not take any medicines to quit smoking unless your doctor says it is okay. Talk with your doctor about counseling or other things that can help you. Talk with your doctor about using more than one strategy at the same time, such as taking medicines while you are also going to in-person counseling. This can help make quitting easier. What things can I do to make it easier to quit? Quitting smoking might feel very hard at first, but there is a lot that you can do to make it easier. Take these steps:  Talk to your family and friends. Ask them to support and encourage you.  Call phone quitlines, reach out to support groups, or work with a Social worker.  Ask people who smoke to not smoke around you.  Avoid places that make you want (trigger) to smoke, such as:  Bars.  Parties.  Smoke-break areas at work.  Spend time with people who do not smoke.  Lower the stress in your life.  Stress can make you want to smoke. Try these things to help your stress:  Getting regular exercise.  Deep-breathing exercises.  Yoga.  Meditating.  Doing a body scan.  To do this, close your eyes, focus on one area of your body at a time from head to toe, and notice which parts of your body are tense. Try to relax the muscles in those areas.  Download or buy apps on your mobile phone or tablet that can help you stick to your quit plan. There are many free apps, such as QuitGuide from the [employer] Office manager for Disease Control and Prevention). You can find more support from smokefree.gov and other websites. This information is not intended to replace advice given to you by your health care provider. Make sure you discuss any questions you have with your health care provider. Document Released: 10/13/2009 Document Revised: 08/14/2016 Document Reviewed: 05/03/2015 Elsevier Interactive Patient Education  2017 [employer].    An after visit summary with all of these plans was given to the patient.

## 2017-04-24 ENCOUNTER — Encounter: Payer: Self-pay | Admitting: Family Medicine

## 2017-04-24 ENCOUNTER — Ambulatory Visit (INDEPENDENT_AMBULATORY_CARE_PROVIDER_SITE_OTHER): Payer: Medicare Other | Admitting: Family Medicine

## 2017-04-24 VITALS — BP 138/64 | HR 73 | Temp 97.5°F | Resp 16 | Ht 61.0 in | Wt 121.0 lb

## 2017-04-24 DIAGNOSIS — F17200 Nicotine dependence, unspecified, uncomplicated: Secondary | ICD-10-CM

## 2017-04-24 DIAGNOSIS — I10 Essential (primary) hypertension: Secondary | ICD-10-CM

## 2017-04-24 DIAGNOSIS — Z0000 Encounter for general adult medical examination without abnormal findings: Secondary | ICD-10-CM

## 2017-04-24 DIAGNOSIS — Z780 Asymptomatic menopausal state: Secondary | ICD-10-CM | POA: Diagnosis not present

## 2017-04-24 DIAGNOSIS — Z23 Encounter for immunization: Secondary | ICD-10-CM

## 2017-04-24 MED ORDER — FLUTICASONE PROPIONATE 50 MCG/ACT NA SUSP: 2.0000 | Freq: Every day | NASAL | 0 refills | Status: DC

## 2017-04-24 MED ORDER — CETIRIZINE HCL 10 MG PO CAPS: 1.0000 | ORAL_CAPSULE | Freq: Every day | ORAL | 0 refills | Status: DC | PRN

## 2017-04-24 MED ORDER — VARENICLINE TARTRATE 1 MG PO TABS: ORAL_TABLET | ORAL | 1 refills | Status: DC

## 2017-04-24 MED ORDER — VALSARTAN 320 MG PO TABS: 320.0000 mg | ORAL_TABLET | Freq: Every day | ORAL | 3 refills | Status: DC

## 2017-04-24 NOTE — Patient Instructions (Addendum)
IF you received an x-ray today, you will receive an invoice from [facility]. Please contact [facility] Radiology at [phone] with questions or concerns regarding your invoice.   IF you received labwork today, you will receive an invoice from [place]. Please contact [facility] at [phone] with questions or concerns regarding your invoice.   Our billing staff will not be able to assist you with questions regarding bills from these companies.  You will be contacted with the lab results as soon as they are available. The fastest way to get your results is to activate your My Chart account. Instructions are located on the last page of this paperwork. If you have not heard from [place] regarding the results in 2 weeks, please contact this office.     We recommend that you schedule a mammogram for breast cancer screening. Typically, you do not need a referral to do this. Please contact a local imaging center to schedule your mammogram.  [facility] - [phone]  *ask for the Radiology Department [facility] ([place]) - [phone] or [phone]  [facility] - [phone] [place] [phone] [facility] - [phone]  *ask for the [facility] - [phone]  *ask for the Radiology Department [facility] - [phone]  *ask for the [place] - [phone]   Steps to Quit Smoking Smoking tobacco can be bad for your health. It can also affect almost every organ in your body. Smoking puts you and people around you at risk for many serious long-lasting (chronic) diseases. Quitting smoking is hard, but it is one of the best things that you can do for your health. It is never too late to quit. What are the benefits of quitting smoking? When you quit smoking, you lower your risk for getting serious diseases and conditions.  They can include:  Lung cancer or lung disease.  Heart disease.  Stroke.  Heart attack.  Not being able to have children (infertility).  Weak bones (osteoporosis) and broken bones (fractures). If you have coughing, wheezing, and shortness of breath, those symptoms may get better when you quit. You may also get sick less often. If you are pregnant, quitting smoking can help to lower your chances of having a baby of low birth weight. What can I do to help me quit smoking? Talk with your doctor about what can help you quit smoking. Some things you can do (strategies) include:  Quitting smoking totally, instead of slowly cutting back how much you smoke over a period of time.  Going to in-person counseling. You are more likely to quit if you go to many counseling sessions.  Using resources and support systems, such as:  Online chats with a Social worker.  Phone quitlines.  Printed Furniture conservator/restorer.  Support groups or group counseling.  Text messaging programs.  Mobile phone apps or applications.  Taking medicines. Some of these medicines may have nicotine in them. If you are pregnant or breastfeeding, do not take any medicines to quit smoking unless your doctor says it is okay. Talk with your doctor about counseling or other things that can help you. Talk with your doctor about using more than one strategy at the same time, such as taking medicines while you are also going to in-person counseling. This can help make quitting easier. What things can I do to make it easier to quit? Quitting smoking might feel  very hard at first, but there is a lot that you can do to make it easier. Take these steps:  Talk to your family and friends. Ask them to support and encourage you.  Call phone quitlines, reach out to support groups, or work with a Social worker.  Ask people who smoke to not smoke around you.  Avoid places that make you want (trigger) to smoke, such  as:  Bars.  Parties.  Smoke-break areas at work.  Spend time with people who do not smoke.  Lower the stress in your life. Stress can make you want to smoke. Try these things to help your stress:  Getting regular exercise.  Deep-breathing exercises.  Yoga.  Meditating.  Doing a body scan. To do this, close your eyes, focus on one area of your body at a time from head to toe, and notice which parts of your body are tense. Try to relax the muscles in those areas.  Download or buy apps on your mobile phone or tablet that can help you stick to your quit plan. There are many free apps, such as QuitGuide from the [employer] Office manager for Disease Control and Prevention). You can find more support from smokefree.gov and other websites. This information is not intended to replace advice given to you by your health care provider. Make sure you discuss any questions you have with your health care provider. Document Released: 10/13/2009 Document Revised: 08/14/2016 Document Reviewed: 05/03/2015 Elsevier Interactive Patient Education  2017 [employer].

## 2017-04-25 LAB — LIPID PANEL
CHOL/HDL RATIO: 2.8 ratio (ref 0.0–4.4)
CHOLESTEROL TOTAL: 257 mg/dL — AB (ref 100–199)
HDL: 93 mg/dL (ref >39)
LDL Calculated: 147 mg/dL — ABNORMAL HIGH (ref 0–99)
TRIGLYCERIDES: 86 mg/dL (ref 0–149)
VLDL Cholesterol Cal: 17 mg/dL (ref 5–40)

## 2017-04-25 LAB — COMPREHENSIVE METABOLIC PANEL
A/G RATIO: 1.8 (ref 1.2–2.2)
ALK PHOS: 73 IU/L (ref 39–117)
ALT: 17 IU/L (ref 0–32)
AST: 22 IU/L (ref 0–40)
Albumin: 5.1 g/dL — ABNORMAL HIGH (ref 3.6–4.8)
BUN/Creatinine Ratio: 14 (ref 12–28)
BUN: 11 mg/dL (ref 8–27)
Bilirubin Total: 0.5 mg/dL (ref 0.0–1.2)
CO2: 22 mmol/L (ref 18–29)
Calcium: 9.4 mg/dL (ref 8.7–10.3)
Chloride: 92 mmol/L — ABNORMAL LOW (ref 96–106)
Creatinine, Ser: 0.77 mg/dL (ref 0.57–1.00)
GFR calc Af Amer: 92 mL/min/{1.73_m2} (ref >59)
GFR, EST NON AFRICAN AMERICAN: 80 mL/min/{1.73_m2} (ref >59)
GLOBULIN, TOTAL: 2.8 g/dL (ref 1.5–4.5)
Glucose: 88 mg/dL (ref 65–99)
POTASSIUM: 4.5 mmol/L (ref 3.5–5.2)
SODIUM: 134 mmol/L (ref 134–144)
Total Protein: 7.9 g/dL (ref 6.0–8.5)

## 2017-04-25 LAB — MICROALBUMIN, URINE: MICROALBUM., U, RANDOM: 7.8 ug/mL

## 2017-04-26 ENCOUNTER — Telehealth: Payer: Self-pay | Admitting: Family Medicine

## 2017-04-26 DIAGNOSIS — Z780 Asymptomatic menopausal state: Secondary | ICD-10-CM

## 2017-04-26 NOTE — Telephone Encounter (Signed)
Please change diagnosis for bone density to estrogen deficiency if applicable for insurance purposes. Thank you!

## 2017-05-06 ENCOUNTER — Telehealth: Payer: Self-pay

## 2017-05-06 MED ORDER — VARENICLINE TARTRATE 0.5 MG X 11 & 1 MG X 42 PO MISC: ORAL | 0 refills | Status: DC

## 2017-05-06 NOTE — Telephone Encounter (Signed)
Requesting start pack of chantix

## 2017-05-06 NOTE — Telephone Encounter (Signed)
Please phone in medication please.

## 2017-07-30 ENCOUNTER — Telehealth: Payer: Self-pay | Admitting: Family Medicine

## 2017-07-30 NOTE — Telephone Encounter (Signed)
DR Nolon Rod PT CALLING STATING THAT [employer] HAD SENT HER A LETTER STATING THAT IT WAS A RECALL ON VALSARTAN PLEASE SEND RX TO [facility] ON [address] [phone]

## 2017-07-30 NOTE — Telephone Encounter (Signed)
Please see note below, thanks.

## 2017-07-31 ENCOUNTER — Telehealth: Payer: Self-pay

## 2017-07-31 ENCOUNTER — Other Ambulatory Visit: Payer: Self-pay | Admitting: Family Medicine

## 2017-07-31 DIAGNOSIS — I10 Essential (primary) hypertension: Secondary | ICD-10-CM

## 2017-07-31 MED ORDER — VALSARTAN 320 MG PO TABS: 320.0000 mg | ORAL_TABLET | Freq: Every day | ORAL | 3 refills | Status: DC

## 2017-07-31 MED ORDER — LOSARTAN POTASSIUM 100 MG PO TABS: 100.0000 mg | ORAL_TABLET | Freq: Every day | ORAL | 3 refills | Status: DC

## 2017-07-31 NOTE — Progress Notes (Unsigned)
Please let the patient know that I changed her valsartan to losartan.  Since this medication is different she should check her blood pressure and if it changes from her baseline to come in sooner for bp management.

## 2017-07-31 NOTE — Telephone Encounter (Signed)
Pt prescription for Lorsartan 100 mg tablet # 30 with 3 refills sent via fax to [facility] on [address] at [phone]. dg

## 2017-08-01 NOTE — Progress Notes (Signed)
Spoke with patient advised valsartan to losartan per Dr. Nolon Rod and since medication is different she will need to check her blood pressures and if they change from her baseline readings she will need to come in sooner for bp management.  Pt agreeable. dg

## 2017-08-05 ENCOUNTER — Telehealth: Payer: Self-pay | Admitting: Family Medicine

## 2017-08-05 ENCOUNTER — Other Ambulatory Visit: Payer: Self-pay

## 2017-08-05 MED ORDER — LOSARTAN POTASSIUM 100 MG PO TABS: 100.0000 mg | ORAL_TABLET | Freq: Every day | ORAL | 3 refills | Status: DC

## 2017-08-05 NOTE — Telephone Encounter (Signed)
Reordered and sent to the pharmacy

## 2017-08-05 NOTE — Telephone Encounter (Signed)
Pt was calling to check on Rx losartan (COZAAR) 100 MG tablet [[id number]]. Note in chart states it was printed, I was unable to locate Rx at 102 or 104.  Please adv,  Pt would like to have Rx called into [facility] on [address], [phone]

## 2017-09-25 ENCOUNTER — Emergency Department (HOSPITAL_BASED_OUTPATIENT_CLINIC_OR_DEPARTMENT_OTHER)
Admission: EM | Admit: 2017-09-25 | Discharge: 2017-09-25 | Disposition: A | Discharge status: Home / Self Care | Payer: Medicare Other | Attending: Emergency Medicine | Admitting: Emergency Medicine

## 2017-09-25 ENCOUNTER — Encounter (HOSPITAL_BASED_OUTPATIENT_CLINIC_OR_DEPARTMENT_OTHER): Payer: Self-pay | Admitting: Emergency Medicine

## 2017-09-25 DIAGNOSIS — W01198A Fall on same level from slipping, tripping and stumbling with subsequent striking against other object, initial encounter: Secondary | ICD-10-CM | POA: Insufficient documentation

## 2017-09-25 DIAGNOSIS — Z79899 Other long term (current) drug therapy: Secondary | ICD-10-CM | POA: Insufficient documentation

## 2017-09-25 DIAGNOSIS — Y929 Unspecified place or not applicable: Secondary | ICD-10-CM | POA: Insufficient documentation

## 2017-09-25 DIAGNOSIS — S0181XA Laceration without foreign body of other part of head, initial encounter: Secondary | ICD-10-CM

## 2017-09-25 DIAGNOSIS — F17210 Nicotine dependence, cigarettes, uncomplicated: Secondary | ICD-10-CM | POA: Insufficient documentation

## 2017-09-25 DIAGNOSIS — S0990XA Unspecified injury of head, initial encounter: Secondary | ICD-10-CM | POA: Diagnosis present

## 2017-09-25 DIAGNOSIS — Y999 Unspecified external cause status: Secondary | ICD-10-CM | POA: Insufficient documentation

## 2017-09-25 DIAGNOSIS — Y9389 Activity, other specified: Secondary | ICD-10-CM | POA: Insufficient documentation

## 2017-09-25 DIAGNOSIS — I10 Essential (primary) hypertension: Secondary | ICD-10-CM | POA: Diagnosis not present

## 2017-09-25 DIAGNOSIS — W19XXXA Unspecified fall, initial encounter: Secondary | ICD-10-CM

## 2017-09-25 MED ORDER — LIDOCAINE-EPINEPHRINE (PF) 2 %-1:200000 IJ SOLN
10.0000 mL | Freq: Once | INTRAMUSCULAR | Status: AC
  Administered 2017-09-25: 10 mL via INTRADERMAL
  Filled 2017-09-25: qty 10

## 2017-09-25 NOTE — ED Notes (Signed)
ED Provider at bedside. 

## 2017-09-25 NOTE — ED Provider Notes (Signed)
[facility] DE[facility] Provider Note   CSN: [id number] Arrival date & time: 09/25/17  1616     History   Chief Complaint Chief Complaint  Patient presents with  . Fall    HPI Anita Calderon is a 66 y.o. female.  HPI  66 year old female presents after sustaining a laceration after a fall. She was carrying rocks when she tripped and fell. She hit her face on some rocks on the ground. She did not hit her head or lose consciousness. She has a vertical laceration in between her nose and lip. She also feels like there is a laceration on the inner upper lip. Her teeth feel fine and feel like they are well aligned. She has no headache, facial pain, or neck pain. She also thinks she bruised her left hand but denies significant pain or trouble moving her wrist or hand. Her last tetanus immunization was within the last year and a half. She is not on blood thinners.  Past Medical History:  Diagnosis Date  . GERD (gastroesophageal reflux disease)   . Heart murmur   . Herpes genitalis in women 2005  . Hyperlipidemia   . Hypertension   . Menopause   . Osteopenia     Patient Active Problem List   Diagnosis Date Noted  . Heart murmur 09/14/2015  . HTN (hypertension) 02/04/2012  . Osteopenia 02/04/2012  . Health care maintenance 02/04/2012  . SMOKER 02/12/2011  . HEMOPTYSIS UNSPECIFIED 02/12/2011    Past Surgical History:  Procedure Laterality Date  . GANGLION CYST EXCISION  1970  . PAROTID GLAND TUMOR EXCISION  2005    OB History    No data available       Home Medications    Prior to Admission medications   Medication Sig Start Date End Date Taking? Authorizing Provider  aspirin 81 MG tablet Take 81 mg by mouth daily.    [provider]  Cetirizine HCl (ZYRTEC ALLERGY) 10 MG CAPS Take 1 capsule (10 mg total) by mouth daily as needed. 04/24/17   Forrest Moron, MD  fluticasone (FLONASE) 50 MCG/ACT nasal spray Place 2 sprays into both nostrils daily. 04/24/17 07/23/17   Forrest Moron, MD  losartan (COZAAR) 100 MG tablet Take 1 tablet (100 mg total) by mouth daily. 08/05/17   Forrest Moron, MD  varenicline (CHANTIX STARTING MONTH PAK) 0.5 MG X 11 & 1 MG X 42 tablet Take one 0.5 mg tablet by mouth once daily for 3 days, then increase to one 0.5 mg tablet twice daily for 4 days, then increase to one 1 mg tablet twice daily. 05/06/17   Forrest Moron, MD    Family History Family History  Problem Relation Age of Onset  . Hypertension Father   . Heart disease Father   . Brain cancer Mother   . Cancer Brother   . Hypertension Brother   . Hypertension Maternal Grandfather   . Heart disease Paternal Grandfather   . Colon cancer Neg Hx   . Diabetes Neg Hx   . Stomach cancer Neg Hx   . Rectal cancer Neg Hx     Social History Social History  Substance Use Topics  . Smoking status: Current Every Day Smoker    Packs/day: 0.50  . Smokeless tobacco: Never Used  . Alcohol use 1.2 oz/week    2 Glasses of wine per week     Comment: 2 glasses of wine daily     Allergies   Ciprofloxacin  Review of Systems Review of Systems  HENT: Negative for dental problem.   Gastrointestinal: Negative for vomiting.  Musculoskeletal: Negative for neck pain.       Left hand bruising  Skin: Positive for wound.  Neurological: Negative for dizziness and headaches.  All other systems reviewed and are negative.    Physical Exam Updated Vital Signs BP (!) 177/87 (BP Location: Left Arm)   Pulse 88   Temp 99 F (37.2 C) (Oral)   Resp 18   Ht 5\' 1"  (1.549 m)   Wt 52.6 kg (116 lb)   SpO2 100%   BMI 21.92 kg/m   Physical Exam  Constitutional: She is oriented to person, place, and time. She appears well-developed and well-nourished.  HENT:  Head: Normocephalic. Head is with laceration.    Right Ear: External ear normal.  Left Ear: External ear normal.  Nose: Nose normal.  There is a small abrasion to inner right upper lip, no laceration or gaping wound    Eyes: Right eye exhibits no discharge. Left eye exhibits no discharge.  Neck: Normal range of motion. Neck supple.  Pulmonary/Chest: Effort normal.  Musculoskeletal:       Left wrist: She exhibits normal range of motion, no tenderness, no bony tenderness and no swelling.       Arms:      Left hand: She exhibits normal range of motion, no tenderness and no swelling.       Hands: Neurological: She is alert and oriented to person, place, and time.  Skin: Skin is warm and dry.  Nursing note and vitals reviewed.    ED Treatments / Results  Labs (all labs ordered are listed, but only abnormal results are displayed) Labs Reviewed - No data to display  EKG  EKG Interpretation None       Radiology No results found.  Procedures .Marland KitchenLaceration Repair Date/Time: 09/25/2017 6:18 PM Performed by: Sherwood Gambler Authorized by: Sherwood Gambler   Consent:    Consent obtained:  Verbal   Consent given by:  Patient   Risks discussed:  Infection, need for additional repair, nerve damage, poor wound healing, poor cosmetic result and pain Anesthesia (see MAR for exact dosages):    Anesthesia method:  Local infiltration   Local anesthetic:  Lidocaine 2% WITH epi Laceration details:    Location:  Face   Length (cm):  3 Repair type:    Repair type:  Simple Pre-procedure details:    Preparation:  Patient was prepped and draped in usual sterile fashion Exploration:    Wound exploration: entire depth of wound probed and visualized     Contaminated: no   Treatment:    Amount of cleaning:  Standard   Irrigation solution:  Sterile saline   Irrigation method:  Syringe Skin repair:    Repair method:  Sutures   Suture size:  6-0   Suture material:  Fast-absorbing gut   Suture technique:  Simple interrupted   Number of sutures:  4 Approximation:    Approximation:  Close   [facility] border: well-aligned   Post-procedure details:    Dressing:  Antibiotic ointment   Patient tolerance of  procedure:  Tolerated well, no immediate complications   (including critical care time)  Medications Ordered in ED Medications  lidocaine-EPINEPHrine (XYLOCAINE W/EPI) 2 %-1:200000 (PF) injection 10 mL (10 mLs Intradermal Given 09/25/17 1656)     Initial Impression / Assessment and Plan / ED Course  I have reviewed the triage vital signs and the nursing  notes.  Pertinent labs & imaging results that were available during my care of the patient were reviewed by me and considered in my medical decision making (see chart for details).     No signs of head trauma and has no headache or neck pain. She has minimal bruising to the left hand and wrist but full range of motion and no tenderness. I do not think CT imaging or x-ray imaging is needed. She is not tender around the laceration in between her nose and lip. She has a very small laceration/abrasion to her inner lip but not big enough to need repair. External laceration repaired as above. Discussed wound care precautions. Her tetanus is up-to-date.  Final Clinical Impressions(s) / ED Diagnoses   Final diagnoses:  Facial laceration, initial encounter  Fall, initial encounter    New Prescriptions New Prescriptions   No medications on file     Sherwood Gambler, MD 09/25/17 [phone]

## 2017-09-25 NOTE — ED Triage Notes (Signed)
Pt tripped and fell outside. Lac noted under nose and inside of upper lip. Bruising to L hand. Pt denies LOC.

## 2017-09-28 ENCOUNTER — Encounter: Payer: Self-pay | Admitting: Family Medicine

## 2017-09-28 ENCOUNTER — Ambulatory Visit (INDEPENDENT_AMBULATORY_CARE_PROVIDER_SITE_OTHER): Payer: Medicare Other | Admitting: Family Medicine

## 2017-09-28 VITALS — BP 150/72 | HR 88 | Temp 97.8°F | Resp 18 | Ht 61.0 in | Wt 118.2 lb

## 2017-09-28 DIAGNOSIS — L0100 Impetigo, unspecified: Secondary | ICD-10-CM

## 2017-09-28 DIAGNOSIS — S0181XS Laceration without foreign body of other part of head, sequela: Secondary | ICD-10-CM | POA: Diagnosis not present

## 2017-09-28 MED ORDER — MUPIROCIN 2 % EX OINT: 1.0000 "application " | TOPICAL_OINTMENT | Freq: Two times a day (BID) | CUTANEOUS | 0 refills | Status: DC

## 2017-09-28 NOTE — Patient Instructions (Addendum)
Impetigo, Adult Impetigo is an infection of the skin. It commonly occurs in young children, but it can also occur in adults. The infection causes itchy blisters and sores that produce brownish-yellow fluid. As the fluid dries, it forms a thick, honey-colored crust. These skin changes usually occur on the face but can also affect other areas of the body. Impetigo usually goes away in 7-10 days with treatment. What are the causes? Impetigo is caused by two types of bacteria. It may be caused by staphylococci or streptococci bacteria. These bacteria cause impetigo when they get under the surface of the skin. This often happens after some damage to the skin, such as damage from:  Cuts, scrapes, or scratches.  Insect bites, especially when you scratch the area of a bite.  Chickenpox or other illnesses that cause open skin sores.  Nail biting or chewing.  Impetigo is contagious and can spread easily from one person to another. This may occur through close skin contact or by sharing towels, clothing, or other items with a person who has the infection. What increases the risk? Some things that can increase the risk of getting this infection include:  Playing sports that include skin-to-skin contact with others.  Having a skin condition with open sores.  Having many skin cuts or scrapes.  Living in an area that has high humidity levels.  Having poor hygiene.  Having high levels of staphylococci in your nose.  What are the signs or symptoms? Impetigo usually starts out as small blisters, often on the face. The blisters then break open and turn into tiny sores (lesions) with a yellow crust. In some cases, the blisters cause itching or burning. With scratching, irritation, or lack of treatment, these small lesions may get larger. Scratching can also cause impetigo to spread to other parts of the body. The bacteria can get under the fingernails and spread when you touch another area of your  skin. Other possible symptoms include:  Larger blisters.  Pus.  Swollen lymph glands.  How is this diagnosed? This condition is usually diagnosed during a physical exam. A skin sample or sample of fluid from a blister may be taken for lab tests that involve growing bacteria (culture test). This can help confirm the diagnosis or help determine the best treatment. How is this treated? Mild impetigo can be treated with prescription antibiotic cream. Oral antibiotic medicine may be used in more severe cases. Medicines for itching may also be used. Follow these instructions at home:  Take medicines only as directed by your health care provider.  To help prevent impetigo from spreading to other body areas: ? Keep your fingernails short and clean. ? Do not scratch the blisters or sores. ? Cover infected areas, if necessary, to keep from scratching.  Gently wash the infected areas with antibiotic soap and water.  Soak crusted areas in warm, soapy water using antibiotic soap. ? Gently rub the areas to remove crusts. Do not scrub.  Wash your hands often to avoid spreading this infection.  Stay home until you have used an antibiotic cream for 48 hours (2 days) or an oral antibiotic medicine for 24 hours (1 day). You should only return to work and activities with other people if your skin shows significant improvement. How is this prevented? To keep the infection from spreading:  Stay home until you have used an antibiotic cream for 48 hours or an oral antibiotic for 24 hours.  Wash your hands often.  Do not engage in   skin-to-skin contact with other people while you have still have blisters.  Do not share towels, washcloths, or bedding with others while you have the infection.  Contact a health care provider if:  You develop more blisters or sores despite treatment.  Other family members get sores.  Your skin sores are not improving after 48 hours of treatment.  You have a  fever. Get help right away if:  You see spreading redness or swelling of the skin around your sores.  You see red streaks coming from your sores.  You develop a sore throat. This information is not intended to replace advice given to you by your health care provider. Make sure you discuss any questions you have with your health care provider. Document Released: 01/07/2015 Document Revised: 05/24/2016 Document Reviewed: 11/30/2014 Elsevier Interactive Patient Education  2017 [employer].     IF you received an x-ray today, you will receive an invoice from [facility]. Please contact [facility] at [phone] with questions or concerns regarding your invoice.   IF you received labwork today, you will receive an invoice from [facility]. Please contact [facility] at [phone] with questions or concerns regarding your invoice.   Our billing staff will not be able to assist you with questions regarding bills from these companies.  You will be contacted with the lab results as soon as they are available. The fastest way to get your results is to activate your My Chart account. Instructions are located on the last page of this paperwork. If you have not heard from [place] regarding the results in 2 weeks, please contact this office.    Impetigo, Adult Impetigo is an infection of the skin. It commonly occurs in young children, but it can also occur in adults. The infection causes itchy blisters and sores that produce brownish-yellow fluid. As the fluid dries, it forms a thick, honey-colored crust. These skin changes usually occur on the face but can also affect other areas of the body. Impetigo usually goes away in 7-10 days with treatment. What are the causes? Impetigo is caused by two types of bacteria. It may be caused by staphylococci or streptococci bacteria. These bacteria cause impetigo when they get under the surface of the skin. This often happens after some damage to the  skin, such as damage from:  Cuts, scrapes, or scratches.  Insect bites, especially when you scratch the area of a bite.  Chickenpox or other illnesses that cause open skin sores.  Nail biting or chewing.  Impetigo is contagious and can spread easily from one person to another. This may occur through close skin contact or by sharing towels, clothing, or other items with a person who has the infection. What increases the risk? Some things that can increase the risk of getting this infection include:  Playing sports that include skin-to-skin contact with others.  Having a skin condition with open sores.  Having many skin cuts or scrapes.  Living in an area that has high humidity levels.  Having poor hygiene.  Having high levels of staphylococci in your nose.  What are the signs or symptoms? Impetigo usually starts out as small blisters, often on the face. The blisters then break open and turn into tiny sores (lesions) with a yellow crust. In some cases, the blisters cause itching or burning. With scratching, irritation, or lack of treatment, these small lesions may get larger. Scratching can also cause impetigo to spread to other parts of the body. The bacteria can get under  the fingernails and spread when you touch another area of your skin. Other possible symptoms include:  Larger blisters.  Pus.  Swollen lymph glands.  How is this diagnosed? This condition is usually diagnosed during a physical exam. A skin sample or sample of fluid from a blister may be taken for lab tests that involve growing bacteria (culture test). This can help confirm the diagnosis or help determine the best treatment. How is this treated? Mild impetigo can be treated with prescription antibiotic cream. Oral antibiotic medicine may be used in more severe cases. Medicines for itching may also be used. Follow these instructions at home:  Take medicines only as directed by your health care provider.  To  help prevent impetigo from spreading to other body areas: ? Keep your fingernails short and clean. ? Do not scratch the blisters or sores. ? Cover infected areas, if necessary, to keep from scratching.  Gently wash the infected areas with antibiotic soap and water.  Soak crusted areas in warm, soapy water using antibiotic soap. ? Gently rub the areas to remove crusts. Do not scrub.  Wash your hands often to avoid spreading this infection.  Stay home until you have used an antibiotic cream for 48 hours (2 days) or an oral antibiotic medicine for 24 hours (1 day). You should only return to work and activities with other people if your skin shows significant improvement. How is this prevented? To keep the infection from spreading:  Stay home until you have used an antibiotic cream for 48 hours or an oral antibiotic for 24 hours.  Wash your hands often.  Do not engage in skin-to-skin contact with other people while you have still have blisters.  Do not share towels, washcloths, or bedding with others while you have the infection.  Contact a health care provider if:  You develop more blisters or sores despite treatment.  Other family members get sores.  Your skin sores are not improving after 48 hours of treatment.  You have a fever. Get help right away if:  You see spreading redness or swelling of the skin around your sores.  You see red streaks coming from your sores.  You develop a sore throat. This information is not intended to replace advice given to you by your health care provider. Make sure you discuss any questions you have with your health care provider. Document Released: 01/07/2015 Document Revised: 05/24/2016 Document Reviewed: 11/30/2014 Elsevier Interactive Patient Education  2017 [employer].

## 2017-09-28 NOTE — Progress Notes (Signed)
9/29/20182:03 PM  Anita Calderon 1951-11-02, 66 y.o. female [id number]  Chief Complaint  Patient presents with  . Wound Infection    Top Lip. Was stitched at [facility] on Wednesday after a fall    HPI:   Patient is a 66 y.o. female who presents today for concerns that her stiches got infected. 3 days ago she tripped and fell onto a pile of rocks causing a small laceration along her philtrum with was repaired with 4 interrupted simple stiches. Last night she started to notice yellowish drainage on abrasion along upper edge of right upper lip, she cleaned it with hydrogen peroxide.  Depression screen [facility] 2/9 09/28/2017 04/24/2017 02/01/2016  Decreased Interest 0 0 0  Down, Depressed, Hopeless 0 0 0  PHQ - 2 Score 0 0 0    Allergies  Allergen Reactions  . Ciprofloxacin     REACTION: hives    Current Outpatient Prescriptions on File Prior to Visit  Medication Sig Dispense Refill  . losartan (COZAAR) 100 MG tablet Take 1 tablet (100 mg total) by mouth daily. 30 tablet 3  . aspirin 81 MG tablet Take 81 mg by mouth daily.    . Cetirizine HCl (ZYRTEC ALLERGY) 10 MG CAPS Take 1 capsule (10 mg total) by mouth daily as needed. (Patient not taking: Reported on 09/28/2017) 90 capsule 0  . fluticasone (FLONASE) 50 MCG/ACT nasal spray Place 2 sprays into both nostrils daily. 16 g 0  . varenicline (CHANTIX STARTING MONTH PAK) 0.5 MG X 11 & 1 MG X 42 tablet Take one 0.5 mg tablet by mouth once daily for 3 days, then increase to one 0.5 mg tablet twice daily for 4 days, then increase to one 1 mg tablet twice daily. (Patient not taking: Reported on 09/28/2017) 53 tablet 0   No current facility-administered medications on file prior to visit.     Past Medical History:  Diagnosis Date  . GERD (gastroesophageal reflux disease)   . Heart murmur   . Herpes genitalis in women 2005  . Hyperlipidemia   . Hypertension   . Menopause   . Osteopenia     Past Surgical History:  Procedure Laterality Date  .  GANGLION CYST EXCISION  1970  . PAROTID GLAND TUMOR EXCISION  2005    Social History  Substance Use Topics  . Smoking status: Current Every Day Smoker    Packs/day: 0.50  . Smokeless tobacco: Never Used  . Alcohol use 1.2 oz/week    2 Glasses of wine per week     Comment: 2 glasses of wine daily    Family History  Problem Relation Age of Onset  . Hypertension Father   . Heart disease Father   . Brain cancer Mother   . Cancer Brother   . Hypertension Brother   . Hypertension Maternal Grandfather   . Heart disease Paternal Grandfather   . Colon cancer Neg Hx   . Diabetes Neg Hx   . Stomach cancer Neg Hx   . Rectal cancer Neg Hx     ROS Per hpi  OBJECTIVE:  Blood pressure (!) 150/72, pulse 88, temperature 97.8 F (36.6 C), temperature source Oral, resp. rate 18, height 5\' 1"  (1.549 m), weight 118 lb 3.2 oz (53.6 kg), SpO2 100 %.  Physical Exam  Constitutional: She is oriented to person, place, and time and well-developed, well-nourished, and in no distress.  HENT:  Head: Normocephalic and atraumatic.  Mouth/Throat: Oropharynx is clear and moist.  Eyes: Pupils  are equal, round, and reactive to light. EOM are normal.  Neck: Neck supple.  Pulmonary/Chest: Effort normal.  Neurological: She is alert and oriented to person, place, and time. Gait normal.  Skin: Skin is warm and dry.     Psychiatric: Her mood appears anxious.    ASSESSMENT and PLAN  1. Impetigo Discussed routine care instructions and precautions. Patient educational handout given.  - mupirocin ointment (BACTROBAN) 2 %; Place 1 application onto affected area 2 (two) times daily.  2. Facial laceration, sequela Laceration wo ssx of infection. RTC precautions given.  Return in about 2 days (around 09/30/2017).    Rutherford Guys, MD [facility] [place], [place] [address] Ph.  [phone] Fax [phone]

## 2017-09-30 ENCOUNTER — Ambulatory Visit (INDEPENDENT_AMBULATORY_CARE_PROVIDER_SITE_OTHER): Payer: Medicare Other | Admitting: Urgent Care

## 2017-09-30 VITALS — BP 166/82 | HR 80 | Temp 97.5°F | Resp 16 | Ht 61.0 in | Wt 118.8 lb

## 2017-09-30 DIAGNOSIS — S0181XD Laceration without foreign body of other part of head, subsequent encounter: Secondary | ICD-10-CM

## 2017-09-30 DIAGNOSIS — L0100 Impetigo, unspecified: Secondary | ICD-10-CM | POA: Diagnosis not present

## 2017-09-30 NOTE — Patient Instructions (Addendum)
Sutured Wound Care Sutures are stitches that can be used to close wounds. Taking care of your wound properly can help to prevent pain and infection. It can also help your wound to heal more quickly. How is this treated? Wound Care  Keep the wound clean and dry.  If you were given a bandage (dressing), you should change it at least once per day or as directed by your health care provider. You should also change it if it becomes wet or dirty.  Keep the wound completely dry for the first 24 hours or as directed by your health care provider. After that time, you may shower or bathe. However, make sure that the wound is not soaked in water until the sutures have been removed.  Clean the wound one time each day or as directed by your health care provider. ? Wash the wound with soap and water. ? Rinse the wound with water to remove all soap. ? Pat the wound dry with a clean towel. Do not rub the wound.  Aftercleaning the wound, apply a thin layer of antibioticointment as directed by your health care provider. This will help to prevent infection and keep the dressing from sticking to the wound.  Have the sutures removed as directed by your health care provider. General Instructions  Take or apply medicines only as directed by your health care provider.  To help prevent scarring, make sure to cover your wound with sunscreen whenever you are outside after the sutures are removed and the wound is healed. Make sure to wear a sunscreen of at least 30 SPF.  If you were prescribed an antibiotic medicine or ointment, finish all of it even if you start to feel better.  Do not scratch or pick at the wound.  Keep all follow-up visits as directed by your health care provider. This is important.  Check your wound every day for signs of infection. Watch for: ? Redness, swelling, or pain. ? Fluid, blood, or pus.  Raise (elevate) the injured area above the level of your heart while you are sitting or  lying down, if possible.  Avoid stretching your wound.  Drink enough fluids to keep your urine clear or pale yellow. Contact a health care provider if:  You received a tetanus shot and you have swelling, severe pain, redness, or bleeding at the injection site.  You have a fever.  A wound that was closed breaks open.  You notice a bad smell coming from the wound.  You notice something coming out of the wound, such as wood or glass.  Your pain is not controlled with medicine.  You have increased redness, swelling, or pain at the site of your wound.  You have fluid, blood, or pus coming from your wound.  You notice a change in the color of your skin near your wound.  You need to change the dressing frequently due to fluid, blood, or pus draining from the wound.  You develop a new rash.  You develop numbness around the wound. Get help right away if:  You develop severe swelling around the injury site.  Your pain suddenly increases and is severe.  You develop painful lumps near the wound or on skin that is anywhere on your body.  You have a red streak going away from your wound.  The wound is on your hand or foot and you cannot properly move a finger or toe.  The wound is on your hand or foot and you notice  that your fingers or toes look pale or bluish. This information is not intended to replace advice given to you by your health care provider. Make sure you discuss any questions you have with your health care provider. Document Released: 01/24/2005 Document Revised: 05/24/2016 Document Reviewed: 07/29/2013 Elsevier Interactive Patient Education  2017 [place], Adult Impetigo is an infection of the skin. It commonly occurs in young children, but it can also occur in adults. The infection causes itchy blisters and sores that produce brownish-yellow fluid. As the fluid dries, it forms a thick, honey-colored crust. These skin changes usually occur on the  face but can also affect other areas of the body. Impetigo usually goes away in 7-10 days with treatment. What are the causes? Impetigo is caused by two types of bacteria. It may be caused by staphylococci or streptococci bacteria. These bacteria cause impetigo when they get under the surface of the skin. This often happens after some damage to the skin, such as damage from:  Cuts, scrapes, or scratches.  Insect bites, especially when you scratch the area of a bite.  Chickenpox or other illnesses that cause open skin sores.  Nail biting or chewing.  Impetigo is contagious and can spread easily from one person to another. This may occur through close skin contact or by sharing towels, clothing, or other items with a person who has the infection. What increases the risk? Some things that can increase the risk of getting this infection include:  Playing sports that include skin-to-skin contact with others.  Having a skin condition with open sores.  Having many skin cuts or scrapes.  Living in an area that has high humidity levels.  Having poor hygiene.  Having high levels of staphylococci in your nose.  What are the signs or symptoms? Impetigo usually starts out as small blisters, often on the face. The blisters then break open and turn into tiny sores (lesions) with a yellow crust. In some cases, the blisters cause itching or burning. With scratching, irritation, or lack of treatment, these small lesions may get larger. Scratching can also cause impetigo to spread to other parts of the body. The bacteria can get under the fingernails and spread when you touch another area of your skin. Other possible symptoms include:  Larger blisters.  Pus.  Swollen lymph glands.  How is this diagnosed? This condition is usually diagnosed during a physical exam. A skin sample or sample of fluid from a blister may be taken for lab tests that involve growing bacteria (culture test). This can help  confirm the diagnosis or help determine the best treatment. How is this treated? Mild impetigo can be treated with prescription antibiotic cream. Oral antibiotic medicine may be used in more severe cases. Medicines for itching may also be used. Follow these instructions at home:  Take medicines only as directed by your health care provider.  To help prevent impetigo from spreading to other body areas: ? Keep your fingernails short and clean. ? Do not scratch the blisters or sores. ? Cover infected areas, if necessary, to keep from scratching.  Gently wash the infected areas with antibiotic soap and water.  Soak crusted areas in warm, soapy water using antibiotic soap. ? Gently rub the areas to remove crusts. Do not scrub.  Wash your hands often to avoid spreading this infection.  Stay home until you have used an antibiotic cream for 48 hours (2 days) or an oral antibiotic medicine for 24 hours (  1 day). You should only return to work and activities with other people if your skin shows significant improvement. How is this prevented? To keep the infection from spreading:  Stay home until you have used an antibiotic cream for 48 hours or an oral antibiotic for 24 hours.  Wash your hands often.  Do not engage in skin-to-skin contact with other people while you have still have blisters.  Do not share towels, washcloths, or bedding with others while you have the infection.  Contact a health care provider if:  You develop more blisters or sores despite treatment.  Other family members get sores.  Your skin sores are not improving after 48 hours of treatment.  You have a fever. Get help right away if:  You see spreading redness or swelling of the skin around your sores.  You see red streaks coming from your sores.  You develop a sore throat. This information is not intended to replace advice given to you by your health care provider. Make sure you discuss any questions you have  with your health care provider. Document Released: 01/07/2015 Document Revised: 05/24/2016 Document Reviewed: 11/30/2014 Elsevier Interactive Patient Education  2017 [employer].     IF you received an x-ray today, you will receive an invoice from [facility]. Please contact [facility] at [phone] with questions or concerns regarding your invoice.   IF you received labwork today, you will receive an invoice from [facility]. Please contact [facility] at [phone] with questions or concerns regarding your invoice.   Our billing staff will not be able to assist you with questions regarding bills from these companies.  You will be contacted with the lab results as soon as they are available. The fastest way to get your results is to activate your My Chart account. Instructions are located on the last page of this paperwork. If you have not heard from [place] regarding the results in 2 weeks, please contact this office.

## 2017-09-30 NOTE — Progress Notes (Signed)
    MRN: [id number] DOB: 06/04/1951  Subjective:   Anita Calderon is a 66 y.o. female presenting for follow up on imetigo. Patient was last seen on 09/28/2017 and was started on mupirocin. She also had dissolve sutures placed over same area on 09/25/2017. Today, she denies fever, drainage of pus or bleeding, erythema, swelling. Reports that she feels better.  Anita Calderon has a current medication list which includes the following prescription(s): aspirin, cetirizine hcl, losartan, mupirocin ointment, varenicline, and fluticasone. Also is allergic to ciprofloxacin.  Anita Calderon  has a past medical history of GERD (gastroesophageal reflux disease); Heart murmur; Herpes genitalis in women (2005); Hyperlipidemia; Hypertension; Menopause; and Osteopenia. Also  has a past surgical history that includes Ganglion cyst excision (1970) and Parotid gland tumor excision (2005).  Objective:   Vitals: Pulse 80   Temp (!) 97.5 F (36.4 C) (Oral)   Resp 16   Ht 5\' 1"  (1.549 m)   Wt 118 lb 12.8 oz (53.9 kg)   SpO2 99%   BMI 22.45 kg/m   Physical Exam  Constitutional: She is oriented to person, place, and time. She appears well-developed and well-nourished.  HENT:  Head:    Cardiovascular: Normal rate.   Pulmonary/Chest: Effort normal.  Neurological: She is alert and oriented to person, place, and time.   Assessment and Plan :   1. Impetigo 2. Facial laceration, subsequent encounter - Healing well. Counseled on wound care. Return-to-clinic precautions discussed, patient verbalized understanding. Otherwise, anticipatory guidance provided.  Jaynee Eagles, PA-C [facility] [phone] 09/30/2017 8:52 AM

## 2019-02-28 ENCOUNTER — Encounter: Payer: Self-pay | Admitting: Internal Medicine

## 2020-07-25 ENCOUNTER — Encounter (HOSPITAL_BASED_OUTPATIENT_CLINIC_OR_DEPARTMENT_OTHER): Payer: Self-pay | Admitting: *Deleted

## 2020-07-25 ENCOUNTER — Other Ambulatory Visit: Payer: Self-pay

## 2020-07-25 ENCOUNTER — Emergency Department (HOSPITAL_BASED_OUTPATIENT_CLINIC_OR_DEPARTMENT_OTHER): Payer: Medicare HMO

## 2020-07-25 ENCOUNTER — Emergency Department (HOSPITAL_BASED_OUTPATIENT_CLINIC_OR_DEPARTMENT_OTHER)
Admission: EM | Admit: 2020-07-25 | Discharge: 2020-07-25 | Disposition: A | Discharge status: Home / Self Care | Payer: Medicare HMO | Attending: Emergency Medicine | Admitting: Emergency Medicine

## 2020-07-25 DIAGNOSIS — A6000 Herpesviral infection of urogenital system, unspecified: Secondary | ICD-10-CM | POA: Diagnosis not present

## 2020-07-25 DIAGNOSIS — F17200 Nicotine dependence, unspecified, uncomplicated: Secondary | ICD-10-CM | POA: Diagnosis not present

## 2020-07-25 DIAGNOSIS — I10 Essential (primary) hypertension: Secondary | ICD-10-CM | POA: Insufficient documentation

## 2020-07-25 DIAGNOSIS — K2980 Duodenitis without bleeding: Secondary | ICD-10-CM | POA: Insufficient documentation

## 2020-07-25 DIAGNOSIS — Z7982 Long term (current) use of aspirin: Secondary | ICD-10-CM | POA: Insufficient documentation

## 2020-07-25 DIAGNOSIS — K219 Gastro-esophageal reflux disease without esophagitis: Secondary | ICD-10-CM | POA: Diagnosis not present

## 2020-07-25 DIAGNOSIS — R1011 Right upper quadrant pain: Secondary | ICD-10-CM | POA: Diagnosis present

## 2020-07-25 LAB — COMPREHENSIVE METABOLIC PANEL
ALT: 14 U/L (ref 0–44)
AST: 20 U/L (ref 15–41)
Albumin: 4.5 g/dL (ref 3.5–5.0)
Alkaline Phosphatase: 61 U/L (ref 38–126)
Anion gap: 11 (ref 5–15)
BUN: 9 mg/dL (ref 8–23)
CO2: 26 mmol/L (ref 22–32)
Calcium: 8.9 mg/dL (ref 8.9–10.3)
Chloride: 96 mmol/L — ABNORMAL LOW (ref 98–111)
Creatinine, Ser: 0.65 mg/dL (ref 0.44–1.00)
GFR calc Af Amer: >60 mL/min (ref >60)
GFR calc non Af Amer: >60 mL/min (ref >60)
Glucose, Bld: 103 mg/dL — ABNORMAL HIGH (ref 70–99)
Potassium: 3.7 mmol/L (ref 3.5–5.1)
Sodium: 133 mmol/L — ABNORMAL LOW (ref 135–145)
Total Bilirubin: 1 mg/dL (ref 0.3–1.2)
Total Protein: 7.7 g/dL (ref 6.5–8.1)

## 2020-07-25 LAB — URINALYSIS, ROUTINE W REFLEX MICROSCOPIC
Bilirubin Urine: NEGATIVE
Glucose, UA: NEGATIVE mg/dL
Ketones, ur: NEGATIVE mg/dL
Nitrite: NEGATIVE
Protein, ur: NEGATIVE mg/dL
Specific Gravity, Urine: 1.01 (ref 1.005–1.030)
pH: 6 (ref 5.0–8.0)

## 2020-07-25 LAB — CBC
HCT: 36.9 % (ref 36.0–46.0)
Hemoglobin: 12.5 g/dL (ref 12.0–15.0)
MCH: 32.9 pg (ref 26.0–34.0)
MCHC: 33.9 g/dL (ref 30.0–36.0)
MCV: 97.1 fL (ref 80.0–100.0)
Platelets: 248 10*3/uL (ref 150–400)
RBC: 3.8 MIL/uL — ABNORMAL LOW (ref 3.87–5.11)
RDW: 13 % (ref 11.5–15.5)
WBC: 11.6 10*3/uL — ABNORMAL HIGH (ref 4.0–10.5)
nRBC: 0 % (ref 0.0–0.2)

## 2020-07-25 LAB — URINALYSIS, MICROSCOPIC (REFLEX)

## 2020-07-25 LAB — LIPASE, BLOOD: Lipase: 18 U/L (ref 11–51)

## 2020-07-25 MED ORDER — HYDROCODONE-ACETAMINOPHEN 5-325 MG PO TABS: 1.0000 | ORAL_TABLET | Freq: Four times a day (QID) | ORAL | 0 refills | Status: DC | PRN

## 2020-07-25 MED ORDER — PANTOPRAZOLE SODIUM 40 MG PO TBEC
40.0000 mg | DELAYED_RELEASE_TABLET | Freq: Once | ORAL | Status: AC
  Administered 2020-07-25: 40 mg via ORAL
  Filled 2020-07-25: qty 1

## 2020-07-25 MED ORDER — ONDANSETRON 4 MG PO TBDP: 4.0000 mg | ORAL_TABLET | ORAL | 0 refills | Status: DC | PRN

## 2020-07-25 MED ORDER — PANTOPRAZOLE SODIUM 20 MG PO TBEC
20.0000 mg | DELAYED_RELEASE_TABLET | Freq: Once | ORAL | Status: DC
  Filled 2020-07-25: qty 1

## 2020-07-25 MED ORDER — AMOXICILLIN-POT CLAVULANATE 875-125 MG PO TABS
1.0000 | ORAL_TABLET | Freq: Two times a day (BID) | ORAL | 0 refills | Status: DC
Start: 2020-07-25 — End: 2020-12-06

## 2020-07-25 MED ORDER — OMEPRAZOLE 20 MG PO CPDR
20.0000 mg | DELAYED_RELEASE_CAPSULE | Freq: Two times a day (BID) | ORAL | 0 refills | Status: DC
Start: 2020-07-25 — End: 2020-12-06

## 2020-07-25 MED ORDER — AMOXICILLIN-POT CLAVULANATE 875-125 MG PO TABS
1.0000 | ORAL_TABLET | Freq: Once | ORAL | Status: AC
  Administered 2020-07-25: 1 via ORAL
  Filled 2020-07-25: qty 1

## 2020-07-25 MED ORDER — SODIUM CHLORIDE 0.9% FLUSH
3.0000 mL | Freq: Once | INTRAVENOUS | Status: DC
  Filled 2020-07-25: qty 3

## 2020-07-25 NOTE — Discharge Instructions (Signed)
1.  You have been given a dose of Augmentin and Protonix in the emergency department this evening.  Pick up your prescriptions tomorrow morning.  Start your Augmentin twice daily tomorrow.  Take omeprazole twice daily.  Try to take it about 30 minutes before you eat or drink. 2.  You need to be on a predominantly liquid diet for the next 2 days.  You may eat if you very low-fat nonirritating snacks such as rice or bread. 3.  Return to the emergency department immediately if you get a fever, suddenly worsening pain, vomiting cannot take your medications or other concerning symptoms. 4.  You should have a recheck within the next 48 hours.  You may return to the emergency department, try to set up a follow-up appointment with [facility] surgery or a family doctor.

## 2020-07-25 NOTE — Progress Notes (Signed)
Patient drinking PO contrast per Radiology Protocol for CT APWO

## 2020-07-25 NOTE — ED Notes (Signed)
Contacted Dr. Harlow Asa @ [phone]

## 2020-07-25 NOTE — ED Provider Notes (Signed)
[facility] EMERGENCY DEPARTMENT Provider Note   CSN: [id number] Arrival date & time: 07/25/20  1216     History Chief Complaint  Patient presents with  . Abdominal Pain  . Back Pain    Anita Calderon is a 69 y.o. female.  HPI Patient reports he started developing right sided abdominal pain yesterday.  It got very severe.  She reports it seemed to actually start in the right flank and then migrated around to her anterior abdomen.  She reports then she felt a lot of aching throughout the entirety of her upper abdomen.  Pain was severe yesterday but now has started to improve quite a bit.  Patient was able to eat breakfast this morning.  It did not change her symptoms.  She did not get worse.  No fevers or chills.  No pain burning urgency with urination.  Patient has not had diarrhea.  She reports a normal bowel movement this morning.  No prior surgical history.  Never experienced similar pain in the past.  Patient reports the last time she ate out was 5 days ago.  She did not have any symptoms at that time.  No sick contacts.  Past Medical History:  Diagnosis Date  . GERD (gastroesophageal reflux disease)   . Heart murmur   . Herpes genitalis in women 2005  . Hyperlipidemia   . Hypertension   . Menopause   . Osteopenia     Patient Active Problem List   Diagnosis Date Noted  . Heart murmur 09/14/2015  . HTN (hypertension) 02/04/2012  . Osteopenia 02/04/2012  . Health care maintenance 02/04/2012  . SMOKER 02/12/2011  . HEMOPTYSIS UNSPECIFIED 02/12/2011    Past Surgical History:  Procedure Laterality Date  . GANGLION CYST EXCISION  1970  . PAROTID GLAND TUMOR EXCISION  2005     OB History   No obstetric history on file.     Family History  Problem Relation Age of Onset  . Hypertension Father   . Heart disease Father   . Brain cancer Mother   . Cancer Brother   . Hypertension Brother   . Hypertension Maternal Grandfather   . Heart disease Paternal  Grandfather   . Colon cancer Neg Hx   . Diabetes Neg Hx   . Stomach cancer Neg Hx   . Rectal cancer Neg Hx     Social History   Tobacco Use  . Smoking status: Current Every Day Smoker    Packs/day: 0.50  . Smokeless tobacco: Never Used  Substance Use Topics  . Alcohol use: Yes    Alcohol/week: 2.0 standard drinks    Types: 2 Glasses of wine per week    Comment: 2 glasses of wine daily  . Drug use: No    Home Medications Prior to Admission medications   Medication Sig Start Date End Date Taking? Authorizing Provider  aspirin 81 MG tablet Take 81 mg by mouth daily.   Yes [provider]  amoxicillin-clavulanate (AUGMENTIN) 875-125 MG tablet Take 1 tablet by mouth 2 (two) times daily. One po bid x 7 days 07/25/20   Charlesetta Shanks, MD  Cetirizine HCl (ZYRTEC ALLERGY) 10 MG CAPS Take 1 capsule (10 mg total) by mouth daily as needed. 04/24/17   Forrest Moron, MD  fluticasone (FLONASE) 50 MCG/ACT nasal spray Place 2 sprays into both nostrils daily. 04/24/17 07/23/17  Forrest Moron, MD  HYDROcodone-acetaminophen (NORCO/VICODIN) 5-325 MG tablet Take 1-2 tablets by mouth every 6 (six) hours  as needed for moderate pain or severe pain. 07/25/20   Charlesetta Shanks, MD  losartan (COZAAR) 100 MG tablet Take 1 tablet (100 mg total) by mouth daily. 08/05/17   Forrest Moron, MD  mupirocin ointment (BACTROBAN) 2 % Place 1 application into the nose 2 (two) times daily. 09/28/17   Rutherford Guys, MD  omeprazole (PRILOSEC) 20 MG capsule Take 1 capsule (20 mg total) by mouth 2 (two) times daily before a meal. 07/25/20   Charlesetta Shanks, MD  ondansetron (ZOFRAN ODT) 4 MG disintegrating tablet Take 1 tablet (4 mg total) by mouth every 4 (four) hours as needed for nausea or vomiting. 07/25/20   Charlesetta Shanks, MD  varenicline (CHANTIX STARTING MONTH PAK) 0.5 MG X 11 & 1 MG X 42 tablet Take one 0.5 mg tablet by mouth once daily for 3 days, then increase to one 0.5 mg tablet twice daily for 4 days,  then increase to one 1 mg tablet twice daily. 05/06/17   Forrest Moron, MD    Allergies    Ciprofloxacin  Review of Systems   Review of Systems 10 systems reviewed and negative except as per HPI. Physical Exam Updated Vital Signs BP (!) 170/79 (BP Location: Right Arm)   Pulse 70   Temp 99.3 F (37.4 C) (Oral)   Resp 18   Ht 5\' 1"  (1.549 m)   Wt 49 kg   SpO2 99%   BMI 20.41 kg/m   Physical Exam Constitutional:      Comments: Alert nontoxic clinically well in appearance.  No respiratory distress.  Eyes:     Extraocular Movements: Extraocular movements intact.     Conjunctiva/sclera: Conjunctivae normal.  Cardiovascular:     Rate and Rhythm: Normal rate and regular rhythm.     Comments: Normal rhythm.  Occasional ectopic beat.  No rub murmur gallop. Pulmonary:     Effort: Pulmonary effort is normal.     Breath sounds: Normal breath sounds.  Abdominal:     Comments: Abdomen is soft.  Patient has involuntary guarding in the right upper quadrant and endorses pain in that area.  Also endorses discomfort in the epigastrium.  Lower abdomen is soft and nontender without guarding.  No CVA tenderness.  Musculoskeletal:        General: No swelling or tenderness. Normal range of motion.     Right lower leg: No edema.     Left lower leg: No edema.  Skin:    General: Skin is warm and dry.  Neurological:     General: No focal deficit present.     Mental Status: She is oriented to person, place, and time.     Coordination: Coordination normal.  Psychiatric:        Mood and Affect: Mood normal.     ED Results / Procedures / Treatments   Labs (all labs ordered are listed, but only abnormal results are displayed) Labs Reviewed  COMPREHENSIVE METABOLIC PANEL - Abnormal; Notable for the following components:      Result Value   Sodium 133 (*)    Chloride 96 (*)    Glucose, Bld 103 (*)    All other components within normal limits  CBC - Abnormal; Notable for the following  components:   WBC 11.6 (*)    RBC 3.80 (*)    All other components within normal limits  URINALYSIS, ROUTINE W REFLEX MICROSCOPIC - Abnormal; Notable for the following components:   Hgb urine dipstick SMALL (*)  Leukocytes,Ua SMALL (*)    All other components within normal limits  URINALYSIS, MICROSCOPIC (REFLEX) - Abnormal; Notable for the following components:   Bacteria, UA MANY (*)    All other components within normal limits  URINE CULTURE  LIPASE, BLOOD    EKG None  Radiology CT Abdomen Pelvis Wo Contrast  Result Date: 07/25/2020 CLINICAL DATA:  Right lower quadrant pain for 2 days EXAM: CT ABDOMEN AND PELVIS WITHOUT CONTRAST TECHNIQUE: Multidetector CT imaging of the abdomen and pelvis was performed following the standard protocol without IV contrast. COMPARISON:  None. FINDINGS: Lower chest: No acute abnormality. Hepatobiliary: No focal liver abnormality is seen. No gallstones, gallbladder wall thickening, or biliary dilatation. Pancreas: Some mild loss of the peripancreatic fat planes is noted in the region of the pancreatic head and second portion of the duodenum. Lipase is normal in these changes are likely related to duodenal inflammation. Spleen: Normal in size without focal abnormality. Adrenals/Urinary Tract: Adrenal glands are within normal limits. Kidneys are well visualize without renal calculi or obstructive change. The bladder is decompressed. Stomach/Bowel: Wall thickening is noted within the cecum without definitive perforation or abscess formation. The appendix is not well appreciated. Stomach is within normal limits. There again noted some inflammatory changes in the region of the second portion of the duodenum likely related to duodenitis. The remainder of the small bowel appears within normal limits. Vascular/Lymphatic: Aortic atherosclerosis. No enlarged abdominal or pelvic lymph nodes. Reproductive: Uterus and bilateral adnexa are unremarkable. Other: No abdominal  wall hernia or abnormality. No abdominopelvic ascites. Musculoskeletal: Degenerative changes of lumbar spine are noted. IMPRESSION: There are inflammatory changes in the region of the second portion of the duodenum and head of the pancreas. Given the normal lipase these changes are likely related to focal duodenitis. Wall thickening and mild inflammatory change in the right lower quadrant related to the cecum. The appendix is not well visualized. No abscess or perforation is noted. No other focal abnormality is noted. Electronically Signed   By: Inez Catalina M.D.   On: 07/25/2020 20:02    Procedures Procedures (including critical care time)  Medications Ordered in ED Medications  sodium chloride flush (NS) 0.9 % injection 3 mL (3 mLs Intravenous Not Given 07/25/20 1611)  amoxicillin-clavulanate (AUGMENTIN) 875-125 MG per tablet 1 tablet (has no administration in time range)  pantoprazole (PROTONIX) EC tablet 40 mg (has no administration in time range)    ED Course  I have reviewed the triage vital signs and the nursing notes.  Pertinent labs & imaging results that were available during my care of the patient were reviewed by me and considered in my medical decision making (see chart for details).  Clinical Course as of Jul 25 2045  Mon Jul 25, 2020  1901 Ultrasound tech apparently completed shift and there was no replacement. This was not communicated to me. Delay in care. Imaging changed to CT abdo Pelvis to further evaluated reproducible abdo pain   [MP]    Clinical Course User Index [MP] Charlesetta Shanks, MD   MDM Rules/Calculators/A&P                         Consult: Reviewed with Dr. Harlow Asa.  We reviewed history of present illness and diagnostic results.  At this time, with patient feeling improved and tolerating oral intake, decision made to treat empirically with Augmentin and PPI.  Recommends clear liquid diet for 48 hours.  Patient is to follow-up within 35  hours for  recheck.   Patient presents with fairly severe abdominal pain yesterday.  It has been improving.  CT shows duodenitis and some inflammation at the cecum.  Patient is alert and well.  She does have reproducible pain.  No fever or vital sign instability.  Reviewed with general surgery.  Will start empirically on Augmentin.  I provided hydrocodone and Zofran to use if needed for nausea.  Patient counseled on clear diet and follow-up check in 48 hours.  Patient counseled if she develops fever, significantly increasing pain, vomiting unable to take medications or other concerning symptoms, she is to return immediately to the emergency department. Final Clinical Impression(s) / ED Diagnoses Final diagnoses:  Acute duodenitis    Rx / DC Orders ED Discharge Orders         Ordered    amoxicillin-clavulanate (AUGMENTIN) 875-125 MG tablet  2 times daily     Discontinue  Reprint     07/25/20 2029    omeprazole (PRILOSEC) 20 MG capsule  2 times daily before meals     Discontinue  Reprint     07/25/20 2029    ondansetron (ZOFRAN ODT) 4 MG disintegrating tablet  Every 4 hours PRN     Discontinue  Reprint     07/25/20 2029    HYDROcodone-acetaminophen (NORCO/VICODIN) 5-325 MG tablet  Every 6 hours PRN     Discontinue  Reprint     07/25/20 2029           Charlesetta Shanks, MD 07/25/20 2049

## 2020-07-25 NOTE — ED Triage Notes (Signed)
Yesterday am she woke with mid abdominal and back tenderness.

## 2020-07-27 ENCOUNTER — Emergency Department (HOSPITAL_BASED_OUTPATIENT_CLINIC_OR_DEPARTMENT_OTHER)
Admission: EM | Admit: 2020-07-27 | Discharge: 2020-07-27 | Disposition: A | Discharge status: Home / Self Care | Payer: Medicare HMO | Attending: Emergency Medicine | Admitting: Emergency Medicine

## 2020-07-27 ENCOUNTER — Other Ambulatory Visit: Payer: Self-pay

## 2020-07-27 ENCOUNTER — Encounter (HOSPITAL_BASED_OUTPATIENT_CLINIC_OR_DEPARTMENT_OTHER): Payer: Self-pay | Admitting: *Deleted

## 2020-07-27 DIAGNOSIS — Z09 Encounter for follow-up examination after completed treatment for conditions other than malignant neoplasm: Secondary | ICD-10-CM | POA: Insufficient documentation

## 2020-07-27 DIAGNOSIS — I10 Essential (primary) hypertension: Secondary | ICD-10-CM | POA: Diagnosis not present

## 2020-07-27 DIAGNOSIS — F17200 Nicotine dependence, unspecified, uncomplicated: Secondary | ICD-10-CM | POA: Insufficient documentation

## 2020-07-27 DIAGNOSIS — Z7982 Long term (current) use of aspirin: Secondary | ICD-10-CM | POA: Diagnosis not present

## 2020-07-27 DIAGNOSIS — Z79899 Other long term (current) drug therapy: Secondary | ICD-10-CM | POA: Insufficient documentation

## 2020-07-27 NOTE — Discharge Instructions (Addendum)
Please return to the ED if you have worsening abdominal pain or fever.  Continue antibiotics.

## 2020-07-27 NOTE — ED Triage Notes (Addendum)
Was here this past Monday for abdominal pain and CT scan was done.  Got diagnosed with duodenitis and stated that she is here for follow-up.  PCP quit and has no PCP at this time.  Went home on antibiotics.  No c/o pain.

## 2020-07-27 NOTE — ED Provider Notes (Signed)
[facility] EMERGENCY DEPARTMENT Provider Note   CSN: [id number] Arrival date & time: 07/27/20  1001     History Chief Complaint  Patient presents with  . CT scan follow -up    Anita Calderon is a 69 y.o. female.  Patient here for follow-up visit after being diagnosed with duodenitis.  Is on antibiotics.  No longer having any abdominal pain.  No fevers.  Does not have a primary care doctor to follow-up with and was told that she needed a 48-hour follow-up.  The history is provided by the patient.  Illness Progression:  Resolved Chronicity:  New Associated symptoms: no abdominal pain, no diarrhea, no fever, no nausea and no vomiting        Past Medical History:  Diagnosis Date  . GERD (gastroesophageal reflux disease)   . Heart murmur   . Herpes genitalis in women 2005  . Hyperlipidemia   . Hypertension   . Menopause   . Osteopenia     Patient Active Problem List   Diagnosis Date Noted  . Heart murmur 09/14/2015  . HTN (hypertension) 02/04/2012  . Osteopenia 02/04/2012  . Health care maintenance 02/04/2012  . SMOKER 02/12/2011  . HEMOPTYSIS UNSPECIFIED 02/12/2011    Past Surgical History:  Procedure Laterality Date  . GANGLION CYST EXCISION  1970  . PAROTID GLAND TUMOR EXCISION  2005     OB History   No obstetric history on file.     Family History  Problem Relation Age of Onset  . Hypertension Father   . Heart disease Father   . Brain cancer Mother   . Cancer Brother   . Hypertension Brother   . Hypertension Maternal Grandfather   . Heart disease Paternal Grandfather   . Colon cancer Neg Hx   . Diabetes Neg Hx   . Stomach cancer Neg Hx   . Rectal cancer Neg Hx     Social History   Tobacco Use  . Smoking status: Current Every Day Smoker    Packs/day: 0.50  . Smokeless tobacco: Never Used  Substance Use Topics  . Alcohol use: Yes    Alcohol/week: 2.0 standard drinks    Types: 2 Glasses of wine per week    Comment: 2 glasses of  wine daily  . Drug use: No    Home Medications Prior to Admission medications   Medication Sig Start Date End Date Taking? Authorizing Provider  amoxicillin-clavulanate (AUGMENTIN) 875-125 MG tablet Take 1 tablet by mouth 2 (two) times daily. One po bid x 7 days 07/25/20   Charlesetta Shanks, MD  aspirin 81 MG tablet Take 81 mg by mouth daily.    [provider]  Cetirizine HCl (ZYRTEC ALLERGY) 10 MG CAPS Take 1 capsule (10 mg total) by mouth daily as needed. 04/24/17   Forrest Moron, MD  fluticasone (FLONASE) 50 MCG/ACT nasal spray Place 2 sprays into both nostrils daily. 04/24/17 07/23/17  Forrest Moron, MD  HYDROcodone-acetaminophen (NORCO/VICODIN) 5-325 MG tablet Take 1-2 tablets by mouth every 6 (six) hours as needed for moderate pain or severe pain. 07/25/20   Charlesetta Shanks, MD  losartan (COZAAR) 100 MG tablet Take 1 tablet (100 mg total) by mouth daily. 08/05/17   Forrest Moron, MD  mupirocin ointment (BACTROBAN) 2 % Place 1 application into the nose 2 (two) times daily. 09/28/17   Rutherford Guys, MD  omeprazole (PRILOSEC) 20 MG capsule Take 1 capsule (20 mg total) by mouth 2 (two) times daily before  a meal. 07/25/20   Charlesetta Shanks, MD  ondansetron (ZOFRAN ODT) 4 MG disintegrating tablet Take 1 tablet (4 mg total) by mouth every 4 (four) hours as needed for nausea or vomiting. 07/25/20   Charlesetta Shanks, MD  varenicline (CHANTIX STARTING MONTH PAK) 0.5 MG X 11 & 1 MG X 42 tablet Take one 0.5 mg tablet by mouth once daily for 3 days, then increase to one 0.5 mg tablet twice daily for 4 days, then increase to one 1 mg tablet twice daily. 05/06/17   Forrest Moron, MD    Allergies    Ciprofloxacin  Review of Systems   Review of Systems  Constitutional: Negative for fever.  Gastrointestinal: Negative for abdominal pain, diarrhea, nausea and vomiting.    Physical Exam Updated Vital Signs BP (!) 171/89 (BP Location: Right Arm)   Pulse 78   Temp 99 F (37.2 C) (Oral)    Resp 18   Ht 5\' 1"  (1.549 m)   Wt 48.5 kg   SpO2 100%   BMI 20.22 kg/m   Physical Exam Constitutional:      General: She is not in acute distress.    Appearance: She is not ill-appearing.  Cardiovascular:     Pulses: Normal pulses.  Pulmonary:     Effort: Pulmonary effort is normal.  Abdominal:     General: Abdomen is flat. There is no distension.     Palpations: There is no mass.     Tenderness: There is no abdominal tenderness. There is no guarding or rebound.     Hernia: No hernia is present.  Neurological:     Mental Status: She is alert.     ED Results / Procedures / Treatments   Labs (all labs ordered are listed, but only abnormal results are displayed) Labs Reviewed - No data to display  EKG None  Radiology CT Abdomen Pelvis Wo Contrast  Result Date: 07/25/2020 CLINICAL DATA:  Right lower quadrant pain for 2 days EXAM: CT ABDOMEN AND PELVIS WITHOUT CONTRAST TECHNIQUE: Multidetector CT imaging of the abdomen and pelvis was performed following the standard protocol without IV contrast. COMPARISON:  None. FINDINGS: Lower chest: No acute abnormality. Hepatobiliary: No focal liver abnormality is seen. No gallstones, gallbladder wall thickening, or biliary dilatation. Pancreas: Some mild loss of the peripancreatic fat planes is noted in the region of the pancreatic head and second portion of the duodenum. Lipase is normal in these changes are likely related to duodenal inflammation. Spleen: Normal in size without focal abnormality. Adrenals/Urinary Tract: Adrenal glands are within normal limits. Kidneys are well visualize without renal calculi or obstructive change. The bladder is decompressed. Stomach/Bowel: Wall thickening is noted within the cecum without definitive perforation or abscess formation. The appendix is not well appreciated. Stomach is within normal limits. There again noted some inflammatory changes in the region of the second portion of the duodenum likely  related to duodenitis. The remainder of the small bowel appears within normal limits. Vascular/Lymphatic: Aortic atherosclerosis. No enlarged abdominal or pelvic lymph nodes. Reproductive: Uterus and bilateral adnexa are unremarkable. Other: No abdominal wall hernia or abnormality. No abdominopelvic ascites. Musculoskeletal: Degenerative changes of lumbar spine are noted. IMPRESSION: There are inflammatory changes in the region of the second portion of the duodenum and head of the pancreas. Given the normal lipase these changes are likely related to focal duodenitis. Wall thickening and mild inflammatory change in the right lower quadrant related to the cecum. The appendix is not well visualized. No abscess  or perforation is noted. No other focal abnormality is noted. Electronically Signed   By: Inez Catalina M.D.   On: 07/25/2020 20:02    Procedures Procedures (including critical care time)  Medications Ordered in ED Medications - No data to display  ED Course  I have reviewed the triage vital signs and the nursing notes.  Pertinent labs & imaging results that were available during my care of the patient were reviewed by me and considered in my medical decision making (see chart for details).    MDM Rules/Calculators/A&P                          Florinda Marker patient is here for recheck after being diagnosed with duodenitis.  Has been on antibiotics for the last several days.  No longer with abdominal pain or fever.  Patient states she feels much better and has no longer any symptoms.  No diarrhea, nausea or vomiting.  Abdominal exam is benign.  She did not have primary care doctor to follow-up with and she was told to follow-up in 48 hours.  Overall patient appears well.  Continue current management.  Understands further return precautions.  This chart was dictated using voice recognition software.  Despite best efforts to proofread,  errors can occur which can change the documentation meaning.     Final Clinical Impression(s) / ED Diagnoses Final diagnoses:  Follow-up exam    Rx / DC Orders ED Discharge Orders    None       Lennice Sites, DO 07/27/20 1031

## 2020-07-28 LAB — URINE CULTURE: Culture: >=100000 — AB

## 2020-07-29 ENCOUNTER — Telehealth: Payer: Self-pay

## 2020-07-29 NOTE — Telephone Encounter (Signed)
Post ED Visit - Positive Culture Follow-up  Culture report reviewed by antimicrobial stewardship pharmacist: [facility] Team []  Elenor Quinones, Pharm.D. []  Heide Guile, Pharm.D., BCPS AQ-ID []  Parks Neptune, Pharm.D., BCPS []  Alycia Rossetti, Pharm.D., BCPS []  [place], Pharm.D., BCPS, AAHIVP []  Legrand Como, Pharm.D., BCPS, AAHIVP [x]  Salome Arnt, PharmD, BCPS []  Johnnette Gourd, PharmD, BCPS []  Hughes Better, PharmD, BCPS []  Leeroy Cha, PharmD []  Laqueta Linden, PharmD, BCPS []  Albertina Parr, PharmD  [facility] Team []  Leodis Sias, PharmD []  Lindell Spar, PharmD []  Royetta Asal, PharmD []  Graylin Shiver, Rph []  Rema Fendt) Glennon Mac, PharmD []  Arlyn Dunning, PharmD []  Netta Cedars, PharmD []  Dia Sitter, PharmD []  Leone Haven, PharmD []  Gretta Arab, PharmD []  Theodis Shove, PharmD []  Peggyann Juba, PharmD []  Reuel Boom, PharmD   Positive urine culture Treated with Amoxicillin, organism sensitive to the same and no further patient follow-up is required at this time.  Genia Del 07/29/2020, 9:27 AM

## 2020-10-20 ENCOUNTER — Encounter: Payer: Self-pay | Admitting: Internal Medicine

## 2020-12-06 ENCOUNTER — Encounter: Payer: Self-pay | Admitting: Family Medicine

## 2020-12-06 ENCOUNTER — Ambulatory Visit (INDEPENDENT_AMBULATORY_CARE_PROVIDER_SITE_OTHER): Payer: Medicare HMO | Admitting: Family Medicine

## 2020-12-06 ENCOUNTER — Other Ambulatory Visit: Payer: Self-pay

## 2020-12-06 VITALS — BP 180/76 | HR 81 | Temp 98.0°F | Ht 60.0 in | Wt 113.8 lb

## 2020-12-06 DIAGNOSIS — Z130 Encounter for screening for diseases of the blood and blood-forming organs and certain disorders involving the immune mechanism: Secondary | ICD-10-CM

## 2020-12-06 DIAGNOSIS — Z13220 Encounter for screening for lipoid disorders: Secondary | ICD-10-CM

## 2020-12-06 DIAGNOSIS — Z131 Encounter for screening for diabetes mellitus: Secondary | ICD-10-CM

## 2020-12-06 DIAGNOSIS — I10 Essential (primary) hypertension: Secondary | ICD-10-CM

## 2020-12-06 DIAGNOSIS — Z1321 Encounter for screening for nutritional disorder: Secondary | ICD-10-CM

## 2020-12-06 DIAGNOSIS — Z1329 Encounter for screening for other suspected endocrine disorder: Secondary | ICD-10-CM

## 2020-12-06 DIAGNOSIS — H9191 Unspecified hearing loss, right ear: Secondary | ICD-10-CM

## 2020-12-06 MED ORDER — AMLODIPINE BESYLATE 5 MG PO TABS: 5.0000 mg | ORAL_TABLET | Freq: Every day | ORAL | 3 refills | Status: DC

## 2020-12-06 MED ORDER — ROSUVASTATIN CALCIUM 10 MG PO TABS: 10.0000 mg | ORAL_TABLET | Freq: Every day | ORAL | 3 refills | Status: DC

## 2020-12-06 MED ORDER — HYDROCHLOROTHIAZIDE 12.5 MG PO TABS: 12.5000 mg | ORAL_TABLET | Freq: Every day | ORAL | 3 refills | Status: DC

## 2020-12-06 NOTE — Patient Instructions (Addendum)
You can get your shingrix at your local pharmacy  Health Maintenance After Age 71 After age 40, you are at a higher risk for certain long-term diseases and infections as well as injuries from falls. Falls are a major cause of broken bones and head injuries in people who are older than age 69. Getting regular preventive care can help to keep you healthy and well. Preventive care includes getting regular testing and making lifestyle changes as recommended by your health care provider. Talk with your health care provider about:  Which screenings and tests you should have. A screening is a test that checks for a disease when you have no symptoms.  A diet and exercise plan that is right for you. What should I know about screenings and tests to prevent falls? Screening and testing are the best ways to find a health problem early. Early diagnosis and treatment give you the best chance of managing medical conditions that are common after age 16. Certain conditions and lifestyle choices may make you more likely to have a fall. Your health care provider may recommend:  Regular vision checks. Poor vision and conditions such as cataracts can make you more likely to have a fall. If you wear glasses, make sure to get your prescription updated if your vision changes.  Medicine review. Work with your health care provider to regularly review all of the medicines you are taking, including over-the-counter medicines. Ask your health care provider about any side effects that may make you more likely to have a fall. Tell your health care provider if any medicines that you take make you feel dizzy or sleepy.  Osteoporosis screening. Osteoporosis is a condition that causes the bones to get weaker. This can make the bones weak and cause them to break more easily.  Blood pressure screening. Blood pressure changes and medicines to control blood pressure can make you feel dizzy.  Strength and balance checks. Your health  care provider may recommend certain tests to check your strength and balance while standing, walking, or changing positions.  Foot health exam. Foot pain and numbness, as well as not wearing proper footwear, can make you more likely to have a fall.  Depression screening. You may be more likely to have a fall if you have a fear of falling, feel emotionally low, or feel unable to do activities that you used to do.  Alcohol use screening. Using too much alcohol can affect your balance and may make you more likely to have a fall. What actions can I take to lower my risk of falls? General instructions  Talk with your health care provider about your risks for falling. Tell your health care provider if: ? You fall. Be sure to tell your health care provider about all falls, even ones that seem minor. ? You feel dizzy, sleepy, or off-balance.  Take over-the-counter and prescription medicines only as told by your health care provider. These include any supplements.  Eat a healthy diet and maintain a healthy weight. A healthy diet includes low-fat dairy products, low-fat (lean) meats, and fiber from whole grains, beans, and lots of fruits and vegetables. Home safety  Remove any tripping hazards, such as rugs, cords, and clutter.  Install safety equipment such as grab bars in bathrooms and safety rails on stairs.  Keep rooms and walkways well-lit. Activity   Follow a regular exercise program to stay fit. This will help you maintain your balance. Ask your health care provider what types of exercise are  appropriate for you.  If you need a cane or walker, use it as recommended by your health care provider.  Wear supportive shoes that have nonskid soles. Lifestyle  Do not drink alcohol if your health care provider tells you not to drink.  If you drink alcohol, limit how much you have: ? 0-1 drink a day for women. ? 0-2 drinks a day for men.  Be aware of how much alcohol is in your drink. In  the [place] one drink equals one typical bottle of beer (12 oz), one-half glass of wine (5 oz), or one shot of hard liquor (1 oz).  Do not use any products that contain nicotine or tobacco, such as cigarettes and e-cigarettes. If you need help quitting, ask your health care provider. Summary  Having a healthy lifestyle and getting preventive care can help to protect your health and wellness after age 28.  Screening and testing are the best way to find a health problem early and help you avoid having a fall. Early diagnosis and treatment give you the best chance for managing medical conditions that are more common for people who are older than age 27.  Falls are a major cause of broken bones and head injuries in people who are older than age 81. Take precautions to prevent a fall at home.  Work with your health care provider to learn what changes you can make to improve your health and wellness and to prevent falls. This information is not intended to replace advice given to you by your health care provider. Make sure you discuss any questions you have with your health care provider. Document Revised: 04/09/2019 Document Reviewed: 10/30/2017 Elsevier Patient Education  [employer].    If you have lab work done today you will be contacted with your lab results within the next 2 weeks.  If you have not heard from [place] then please contact us. The fastest way to get your results is to register for My Chart.   IF you received an x-ray today, you will receive an invoice from [facility]. Please contact [facility] at [phone] with questions or concerns regarding your invoice.   IF you received labwork today, you will receive an invoice from [facility]. Please contact [facility] at [phone] with questions or concerns regarding your invoice.   Our billing staff will not be able to assist you with questions regarding bills from these companies.  You will be contacted with  the lab results as soon as they are available. The fastest way to get your results is to activate your My Chart account. Instructions are located on the last page of this paperwork. If you have not heard from [place] regarding the results in 2 weeks, please contact this office.

## 2020-12-06 NOTE — Progress Notes (Signed)
12/7/20214:03 PM  Anita Calderon January 01, 1951, 69 y.o., female [id number]  Chief Complaint  Patient presents with  . Establish Care    est care. Patient have concern with her elevated bp and may need to be on medication    HPI:   Patient is a 69 y.o. female with past medical history significant for HTN who presents today to establish care.  Walks 2 miles per day Stopped taking medications 2 years ago [place] Retired from [employer] Started a used car business Husband died 31-Mar-2014 Son in [place] neighborhood support   HTN Was previously on valsartan and losartan, amlodipine,  Atorvastatin, lisinopril,  BP Readings from Last 3 Encounters:  12/06/20 (!) 201/77  07/27/20 (!) 171/89  07/25/20 (!) 169/76   Mammogram next week Colonoscopy 12/23 Eye doctor Jan Flu: declines Covid: had both Needs 2nd shingrix (at local pharmacy)   Depression screen [facility] 2/9 12/06/2020 09/28/2017 04/24/2017  Decreased Interest 0 0 0  Down, Depressed, Hopeless 0 0 0  PHQ - 2 Score 0 0 0    Fall Risk  12/06/2020 09/28/2017 04/24/2017 02/01/2016 07/25/2015  Falls in the past year? 0 Yes No No No  Number falls in past yr: 0 1 - - -  Injury with Fall? 0 Yes - - -  Follow up Falls evaluation completed - - - -     Allergies  Allergen Reactions  . Ciprofloxacin     REACTION: hives    Prior to Admission medications   Medication Sig Start Date End Date Taking? Authorizing Provider  amoxicillin-clavulanate (AUGMENTIN) 875-125 MG tablet Take 1 tablet by mouth 2 (two) times daily. One po bid x 7 days 07/25/20   Charlesetta Shanks, MD  aspirin 81 MG tablet Take 81 mg by mouth daily.    [provider]  Cetirizine HCl (ZYRTEC ALLERGY) 10 MG CAPS Take 1 capsule (10 mg total) by mouth daily as needed. 04/24/17   Forrest Moron, MD  fluticasone (FLONASE) 50 MCG/ACT nasal spray Place 2 sprays into both nostrils daily. 04/24/17 07/23/17  Forrest Moron, MD   HYDROcodone-acetaminophen (NORCO/VICODIN) 5-325 MG tablet Take 1-2 tablets by mouth every 6 (six) hours as needed for moderate pain or severe pain. 07/25/20   Charlesetta Shanks, MD  losartan (COZAAR) 100 MG tablet Take 1 tablet (100 mg total) by mouth daily. 08/05/17   Forrest Moron, MD  mupirocin ointment (BACTROBAN) 2 % Place 1 application into the nose 2 (two) times daily. 09/28/17   Rutherford Guys, MD  omeprazole (PRILOSEC) 20 MG capsule Take 1 capsule (20 mg total) by mouth 2 (two) times daily before a meal. 07/25/20   Charlesetta Shanks, MD  ondansetron (ZOFRAN ODT) 4 MG disintegrating tablet Take 1 tablet (4 mg total) by mouth every 4 (four) hours as needed for nausea or vomiting. 07/25/20   Charlesetta Shanks, MD  varenicline (CHANTIX STARTING MONTH PAK) 0.5 MG X 11 & 1 MG X 42 tablet Take one 0.5 mg tablet by mouth once daily for 3 days, then increase to one 0.5 mg tablet twice daily for 4 days, then increase to one 1 mg tablet twice daily. 05/06/17   Forrest Moron, MD    Past Medical History:  Diagnosis Date  . GERD (gastroesophageal reflux disease)   . Heart murmur   . Herpes genitalis in women 03/31/2004  . Hyperlipidemia   . Hypertension   . Menopause   . Osteopenia     Past Surgical  History:  Procedure Laterality Date  . GANGLION CYST EXCISION  1970  . PAROTID GLAND TUMOR EXCISION  2005    Social History   Tobacco Use  . Smoking status: Current Every Day Smoker    Packs/day: 0.50  . Smokeless tobacco: Never Used  Substance Use Topics  . Alcohol use: Yes    Alcohol/week: 2.0 standard drinks    Types: 2 Glasses of wine per week    Comment: 2 glasses of wine daily    Family History  Problem Relation Age of Onset  . Hypertension Father   . Heart disease Father   . Brain cancer Mother   . Cancer Brother   . Hypertension Brother   . Hypertension Maternal Grandfather   . Heart disease Paternal Grandfather   . Colon cancer Neg Hx   . Diabetes Neg Hx   . Stomach cancer Neg  Hx   . Rectal cancer Neg Hx     Review of Systems  Constitutional: Negative for chills, fever and malaise/fatigue.  HENT: Positive for hearing loss. Negative for tinnitus.   Eyes: Negative for blurred vision and double vision.  Respiratory: Negative for cough, shortness of breath and wheezing.   Cardiovascular: Positive for palpitations. Negative for chest pain and leg swelling.  Gastrointestinal: Positive for heartburn (Take tums, works well). Negative for abdominal pain, blood in stool, constipation, diarrhea, nausea and vomiting.  Genitourinary: Negative for dysuria, frequency and hematuria.  Musculoskeletal: Negative for back pain and joint pain.  Skin: Negative for rash.  Neurological: Negative for dizziness, tingling, weakness and headaches.     OBJECTIVE:  Today's Vitals   12/06/20 1506  BP: (!) 201/77  Pulse: 81  Temp: 98 F (36.7 C)  TempSrc: Temporal  SpO2: 99%  Weight: 113 lb 12.8 oz (51.6 kg)  Height: 5' (1.524 m)   Body mass index is 22.23 kg/m.   Physical Exam Constitutional:      General: She is not in acute distress.    Appearance: Normal appearance. She is not ill-appearing.  HENT:     Head: Normocephalic.     Right Ear: Tympanic membrane, ear canal and external ear normal. There is no impacted cerumen.     Left Ear: Tympanic membrane, ear canal and external ear normal. There is no impacted cerumen.  Cardiovascular:     Rate and Rhythm: Normal rate and regular rhythm.     Pulses: Normal pulses.     Heart sounds: Normal heart sounds. No murmur heard.  No friction rub. No gallop.   Pulmonary:     Effort: Pulmonary effort is normal. No respiratory distress.     Breath sounds: Normal breath sounds. No stridor. No wheezing, rhonchi or rales.  Abdominal:     General: Bowel sounds are normal.     Palpations: Abdomen is soft.     Tenderness: There is no abdominal tenderness.  Musculoskeletal:     Right lower leg: No edema.     Left lower leg: No  edema.  Skin:    General: Skin is warm and dry.  Neurological:     Mental Status: She is alert and oriented to person, place, and time.  Psychiatric:        Mood and Affect: Mood normal.        Behavior: Behavior normal.     No results found for this or any previous visit (from the past 24 hour(s)).  No results found.   ASSESSMENT and PLAN  Problem List Items Addressed  This Visit    None    Visit Diagnoses    Screening cholesterol level    -  Primary   Relevant Medications   rosuvastatin (CRESTOR) 10 MG tablet R/se/b discussed   Other Relevant Orders   Lipid Panel   Diabetes mellitus screening       Relevant Orders   Comprehensive metabolic panel   Hemoglobin A1c   Encounter for vitamin deficiency screening       Relevant Orders   Vitamin D, 25-hydroxy   Screening for deficiency anemia       Relevant Orders   CBC   Screening for thyroid disorder       Relevant Orders   TSH   Essential hypertension       Relevant Medications   hydrochlorothiazide (HYDRODIURIL) 12.5 MG tablet   amLODipine (NORVASC) 5 MG tablet   rosuvastatin (CRESTOR) 10 MG tablet R/se/b discussed RTC precautions provided BP not at goal. Return to clinic in 2 weeks. Will follow up with lab results    Progressive hearing loss of right ear          Return in about 2 weeks (around 12/20/2020) for Medication/BP recheck.   Huston Foley Kesean Serviss, FNP-BC Primary [facility] at [facility], [place] [address] Ph.  [phone] Fax [phone]

## 2020-12-07 ENCOUNTER — Telehealth: Payer: Self-pay | Admitting: Family Medicine

## 2020-12-07 LAB — LIPID PANEL
Chol/HDL Ratio: 2.7 ratio (ref 0.0–4.4)
Cholesterol, Total: 232 mg/dL — ABNORMAL HIGH (ref 100–199)
HDL: 85 mg/dL (ref >39)
LDL Chol Calc (NIH): 134 mg/dL — ABNORMAL HIGH (ref 0–99)
Triglycerides: 76 mg/dL (ref 0–149)
VLDL Cholesterol Cal: 13 mg/dL (ref 5–40)

## 2020-12-07 LAB — CBC
Hematocrit: 41.3 % (ref 34.0–46.6)
Hemoglobin: 14 g/dL (ref 11.1–15.9)
MCH: 32 pg (ref 26.6–33.0)
MCHC: 33.9 g/dL (ref 31.5–35.7)
MCV: 95 fL (ref 79–97)
Platelets: 267 10*3/uL (ref 150–450)
RBC: 4.37 x10E6/uL (ref 3.77–5.28)
RDW: 12.4 % (ref 11.7–15.4)
WBC: 8.2 10*3/uL (ref 3.4–10.8)

## 2020-12-07 LAB — COMPREHENSIVE METABOLIC PANEL
ALT: 11 IU/L (ref 0–32)
AST: 15 IU/L (ref 0–40)
Albumin/Globulin Ratio: 2.1 (ref 1.2–2.2)
Albumin: 5 g/dL — ABNORMAL HIGH (ref 3.7–4.7)
Alkaline Phosphatase: 76 IU/L (ref 44–121)
BUN/Creatinine Ratio: 16 (ref 12–28)
BUN: 13 mg/dL (ref 8–27)
Bilirubin Total: 0.4 mg/dL (ref 0.0–1.2)
CO2: 22 mmol/L (ref 20–29)
Calcium: 9.9 mg/dL (ref 8.7–10.3)
Chloride: 99 mmol/L (ref 96–106)
Creatinine, Ser: 0.8 mg/dL (ref 0.57–1.00)
GFR calc Af Amer: 86 mL/min/{1.73_m2} (ref >59)
GFR calc non Af Amer: 74 mL/min/{1.73_m2} (ref >59)
Globulin, Total: 2.4 g/dL (ref 1.5–4.5)
Glucose: 86 mg/dL (ref 65–99)
Potassium: 4.5 mmol/L (ref 3.5–5.2)
Sodium: 139 mmol/L (ref 134–144)
Total Protein: 7.4 g/dL (ref 6.0–8.5)

## 2020-12-07 LAB — VITAMIN D 25 HYDROXY (VIT D DEFICIENCY, FRACTURES): Vit D, 25-Hydroxy: 53.9 ng/mL (ref 30.0–100.0)

## 2020-12-07 LAB — HEMOGLOBIN A1C
Est. average glucose Bld gHb Est-mCnc: 100 mg/dL
Hgb A1c MFr Bld: 5.1 % (ref 4.8–5.6)

## 2020-12-07 LAB — TSH: TSH: 2.16 u[IU]/mL (ref 0.450–4.500)

## 2020-12-07 NOTE — Telephone Encounter (Signed)
Patient missed a call from our office with lab results / pt missed call and would like [place] to call her back

## 2020-12-07 NOTE — Progress Notes (Signed)
If you could let Anita Calderon know her cholesterol is quite elevated. The crestor we started yesterday should help with that. The rest of her labs look great!

## 2020-12-08 ENCOUNTER — Ambulatory Visit (AMBULATORY_SURGERY_CENTER): Payer: Self-pay

## 2020-12-08 ENCOUNTER — Other Ambulatory Visit: Payer: Self-pay | Admitting: Family Medicine

## 2020-12-08 ENCOUNTER — Other Ambulatory Visit: Payer: Self-pay

## 2020-12-08 VITALS — Ht 60.5 in | Wt 113.5 lb

## 2020-12-08 DIAGNOSIS — Z86010 Personal history of colonic polyps: Secondary | ICD-10-CM

## 2020-12-08 DIAGNOSIS — Z1231 Encounter for screening mammogram for malignant neoplasm of breast: Secondary | ICD-10-CM

## 2020-12-08 MED ORDER — NA SULFATE-K SULFATE-MG SULF 17.5-3.13-1.6 GM/177ML PO SOLN: 1.0000 | Freq: Once | ORAL | 0 refills | Status: AC

## 2020-12-08 NOTE — Progress Notes (Signed)
No egg or soy allergy known to patient  No issues with past sedation with any surgeries or procedures no intubation problems in the past  No FH of Malignant Hyperthermia No diet pills per patient No home 02 use per patient  No blood thinners per patient   Pt takes colace daily for  Constipation, states bm every 1- 2 days usually, pt to take magnesium citrate on Tuesday night 12/21 prior to clear liquid day.  No A fib or A flutter  EMMI video to pt or via [place] 19 guidelines implemented in [place] today with Pt and RN   Suprep Coupon given to pt in [place] today , Code to Pharmacy   Due to the COVID-19 pandemic we are asking patients to follow these guidelines. Please only bring one care partner. Please be aware that your care partner may wait in the car in the parking lot or if they feel like they will be too hot to wait in the car, they may wait in the lobby on the 4th floor. All care partners are required to wear a mask the entire time (we do not have any that we can provide them), they need to practice social distancing, and we will do a Covid check for all patient's and care partners when you arrive. Also we will check their temperature and your temperature. If the care partner waits in their car they need to stay in the parking lot the entire time and we will call them on their cell phone when the patient is ready for discharge so they can bring the car to the front of the building. Also all patient's will need to wear a mask into building.

## 2020-12-14 ENCOUNTER — Ambulatory Visit
Admission: RE | Admit: 2020-12-14 | Discharge: 2020-12-14 | Disposition: A | Discharge status: Home / Self Care | Payer: Medicare HMO | Source: Ambulatory Visit | Attending: Family Medicine | Admitting: Family Medicine

## 2020-12-14 ENCOUNTER — Encounter: Payer: Self-pay | Admitting: Internal Medicine

## 2020-12-14 ENCOUNTER — Other Ambulatory Visit: Payer: Self-pay

## 2020-12-14 DIAGNOSIS — Z1231 Encounter for screening mammogram for malignant neoplasm of breast: Secondary | ICD-10-CM

## 2020-12-20 ENCOUNTER — Other Ambulatory Visit: Payer: Self-pay

## 2020-12-20 ENCOUNTER — Encounter: Payer: Self-pay | Admitting: Family Medicine

## 2020-12-20 ENCOUNTER — Ambulatory Visit: Payer: Medicare HMO | Admitting: Family Medicine

## 2020-12-20 VITALS — BP 159/67 | HR 82 | Temp 97.3°F | Ht 60.5 in | Wt 112.0 lb

## 2020-12-20 DIAGNOSIS — I10 Essential (primary) hypertension: Secondary | ICD-10-CM

## 2020-12-20 DIAGNOSIS — F17200 Nicotine dependence, unspecified, uncomplicated: Secondary | ICD-10-CM | POA: Diagnosis not present

## 2020-12-20 DIAGNOSIS — E7849 Other hyperlipidemia: Secondary | ICD-10-CM

## 2020-12-20 NOTE — Patient Instructions (Addendum)
Take 2 HCTZ per day If  By January 4th your home BP is greater than 130/80 5 out of 7 days of the week Start taking 2 Amlodipine per day.  Please call the clinic to update or with any issues.    Managing Your Hypertension Hypertension is commonly called high blood pressure. This is when the force of your blood pressing against the walls of your arteries is too strong. Arteries are blood vessels that carry blood from your heart throughout your body. Hypertension forces the heart to work harder to pump blood, and may cause the arteries to become narrow or stiff. Having untreated or uncontrolled hypertension can cause heart attack, stroke, kidney disease, and other problems. What are blood pressure readings? A blood pressure reading consists of a higher number over a lower number. Ideally, your blood pressure should be below 120/80. The first ("top") number is called the systolic pressure. It is a measure of the pressure in your arteries as your heart beats. The second ("bottom") number is called the diastolic pressure. It is a measure of the pressure in your arteries as the heart relaxes. What does my blood pressure reading mean? Blood pressure is classified into four stages. Based on your blood pressure reading, your health care provider may use the following stages to determine what type of treatment you need, if any. Systolic pressure and diastolic pressure are measured in a unit called mm Hg. Normal  Systolic pressure: below 938.  Diastolic pressure: below 80. Elevated  Systolic pressure: 182-993.  Diastolic pressure: below 80. Hypertension stage 1  Systolic pressure: 716-967.  Diastolic pressure: 89-38. Hypertension stage 2  Systolic pressure: 101 or above.  Diastolic pressure: 90 or above. What health risks are associated with hypertension? Managing your hypertension is an important responsibility. Uncontrolled hypertension can lead to:  A heart attack.  A stroke.  A  weakened blood vessel (aneurysm).  Heart failure.  Kidney damage.  Eye damage.  Metabolic syndrome.  Memory and concentration problems. What changes can I make to manage my hypertension? Hypertension can be managed by making lifestyle changes and possibly by taking medicines. Your health care provider will help you make a plan to bring your blood pressure within a normal range. Eating and drinking   Eat a diet that is high in fiber and potassium, and low in salt (sodium), added sugar, and fat. An example eating plan is called the DASH (Dietary Approaches to Stop Hypertension) diet. To eat this way: ? Eat plenty of fresh fruits and vegetables. Try to fill half of your plate at each meal with fruits and vegetables. ? Eat whole grains, such as whole wheat pasta, brown rice, or whole grain bread. Fill about one quarter of your plate with whole grains. ? Eat low-fat diary products. ? Avoid fatty cuts of meat, processed or cured meats, and poultry with skin. Fill about one quarter of your plate with lean proteins such as fish, chicken without skin, beans, eggs, and tofu. ? Avoid premade and processed foods. These tend to be higher in sodium, added sugar, and fat.  Reduce your daily sodium intake. Most people with hypertension should eat less than 1,500 mg of sodium a day.  Limit alcohol intake to no more than 1 drink a day for nonpregnant women and 2 drinks a day for men. One drink equals 12 oz of beer, 5 oz of wine, or 1 oz of hard liquor. Lifestyle  Work with your health care provider to maintain a healthy body  weight, or to lose weight. Ask what an ideal weight is for you.  Get at least 30 minutes of exercise that causes your heart to beat faster (aerobic exercise) most days of the week. Activities may include walking, swimming, or biking.  Include exercise to strengthen your muscles (resistance exercise), such as weight lifting, as part of your weekly exercise routine. Try to do these  types of exercises for 30 minutes at least 3 days a week.  Do not use any products that contain nicotine or tobacco, such as cigarettes and e-cigarettes. If you need help quitting, ask your health care provider.  Control any long-term (chronic) conditions you have, such as high cholesterol or diabetes. Monitoring  Monitor your blood pressure at home as told by your health care provider. Your personal target blood pressure may vary depending on your medical conditions, your age, and other factors.  Have your blood pressure checked regularly, as often as told by your health care provider. Working with your health care provider  Review all the medicines you take with your health care provider because there may be side effects or interactions.  Talk with your health care provider about your diet, exercise habits, and other lifestyle factors that may be contributing to hypertension.  Visit your health care provider regularly. Your health care provider can help you create and adjust your plan for managing hypertension. Will I need medicine to control my blood pressure? Your health care provider may prescribe medicine if lifestyle changes are not enough to get your blood pressure under control, and if:  Your systolic blood pressure is 130 or higher.  Your diastolic blood pressure is 80 or higher. Take medicines only as told by your health care provider. Follow the directions carefully. Blood pressure medicines must be taken as prescribed. The medicine does not work as well when you skip doses. Skipping doses also puts you at risk for problems. Contact a health care provider if:  You think you are having a reaction to medicines you have taken.  You have repeated (recurrent) headaches.  You feel dizzy.  You have swelling in your ankles.  You have trouble with your vision. Get help right away if:  You develop a severe headache or confusion.  You have unusual weakness or numbness, or you  feel faint.  You have severe pain in your chest or abdomen.  You vomit repeatedly.  You have trouble breathing. Summary  Hypertension is when the force of blood pumping through your arteries is too strong. If this condition is not controlled, it may put you at risk for serious complications.  Your personal target blood pressure may vary depending on your medical conditions, your age, and other factors. For most people, a normal blood pressure is less than 120/80.  Hypertension is managed by lifestyle changes, medicines, or both. Lifestyle changes include weight loss, eating a healthy, low-sodium diet, exercising more, and limiting alcohol. This information is not intended to replace advice given to you by your health care provider. Make sure you discuss any questions you have with your health care provider. Document Revised: 04/10/2019 Document Reviewed: 11/14/2016 Elsevier Patient Education  [employer].   If you have lab work done today you will be contacted with your lab results within the next 2 weeks.  If you have not heard from [place] then please contact us. The fastest way to get your results is to register for My Chart.   IF you received an x-ray today, you will receive  an Pharmacologist from [facility]. Please contact [facility] at [phone] with questions or concerns regarding your invoice.   IF you received labwork today, you will receive an invoice from [facility]. Please contact [facility] at [phone] with questions or concerns regarding your invoice.   Our billing staff will not be able to assist you with questions regarding bills from these companies.  You will be contacted with the lab results as soon as they are available. The fastest way to get your results is to activate your My Chart account. Instructions are located on the last page of this paperwork. If you have not heard from [place] regarding the results in 2 weeks, please contact this office.

## 2020-12-20 NOTE — Progress Notes (Signed)
12/21/20212:20 PM  Anita Calderon 1951-08-06, 69 y.o., female [id number]  Chief Complaint  Patient presents with  . blood pressure follow up     HPI:   Patient is a 69 y.o. female with past medical history significant for HTN who presents today for BP f/u.   HTN Was previously on valsartan and losartan, amlodipine,  Atorvastatin, lisinopril,   Currently on Amlodipine 5mg  daily HCTZ 12.5mg  daily BP goal  < 130/80 BP Readings from Last 3 Encounters:  12/20/20 (!) 159/67  12/06/20 (!) 180/76  07/27/20 (!) 171/89   HLD Crestor 10mg  daily Lab Results  Component Value Date   CHOL 232 (H) 12/06/2020   HDL 85 12/06/2020   LDLCALC 134 (H) 12/06/2020   TRIG 76 12/06/2020   CHOLHDL 2.7 12/06/2020   The 10-year ASCVD risk score Mikey Bussing DC Jr., et al., 2013) is: 30.6%   Values used to calculate the score:     Age: 66 years     Sex: Female     Is Non-Hispanic African American: No     Diabetic: No     Tobacco smoker: Yes     Systolic Blood Pressure: [id number] mmHg     Is BP treated: Yes     HDL Cholesterol: 85 mg/dL     Total Cholesterol: 232 mg/dL  Lab Results  Component Value Date   CALCIUM 9.9 12/06/2020   Last vitamin D Lab Results  Component Value Date   VD25OH 53.9 12/06/2020   Mammogram: 12/14/20: [facility] 1 Colonoscopy 12/23 Eye doctor Jan Flu: declines Covid: had both Needs 2nd shingrix (at local pharmacy) Current smoker: low dose CT ordered 12/21   Depression screen [facility] 2/9 12/20/2020 12/06/2020 09/28/2017  Decreased Interest 0 0 0  Down, Depressed, Hopeless 0 0 0  PHQ - 2 Score 0 0 0    Fall Risk  12/20/2020 12/06/2020 09/28/2017 04/24/2017 02/01/2016  Falls in the past year? 0 0 Yes No No  Number falls in past yr: 0 0 1 - -  Injury with Fall? 0 0 Yes - -  Follow up Falls evaluation completed Falls evaluation completed - - -     Allergies  Allergen Reactions  . Ciprofloxacin     REACTION: hives    Prior to Admission medications   Medication Sig Start  Date End Date Taking? Authorizing Provider  amoxicillin-clavulanate (AUGMENTIN) 875-125 MG tablet Take 1 tablet by mouth 2 (two) times daily. One po bid x 7 days 07/25/20   Charlesetta Shanks, MD  aspirin 81 MG tablet Take 81 mg by mouth daily.    [provider]  Cetirizine HCl (ZYRTEC ALLERGY) 10 MG CAPS Take 1 capsule (10 mg total) by mouth daily as needed. 04/24/17   Forrest Moron, MD  fluticasone (FLONASE) 50 MCG/ACT nasal spray Place 2 sprays into both nostrils daily. 04/24/17 07/23/17  Forrest Moron, MD  HYDROcodone-acetaminophen (NORCO/VICODIN) 5-325 MG tablet Take 1-2 tablets by mouth every 6 (six) hours as needed for moderate pain or severe pain. 07/25/20   Charlesetta Shanks, MD  losartan (COZAAR) 100 MG tablet Take 1 tablet (100 mg total) by mouth daily. 08/05/17   Forrest Moron, MD  mupirocin ointment (BACTROBAN) 2 % Place 1 application into the nose 2 (two) times daily. 09/28/17   Rutherford Guys, MD  omeprazole (PRILOSEC) 20 MG capsule Take 1 capsule (20 mg total) by mouth 2 (two) times daily before a meal. 07/25/20   Charlesetta Shanks, MD  ondansetron (ZOFRAN ODT)  4 MG disintegrating tablet Take 1 tablet (4 mg total) by mouth every 4 (four) hours as needed for nausea or vomiting. 07/25/20   Charlesetta Shanks, MD  varenicline (CHANTIX STARTING MONTH PAK) 0.5 MG X 11 & 1 MG X 42 tablet Take one 0.5 mg tablet by mouth once daily for 3 days, then increase to one 0.5 mg tablet twice daily for 4 days, then increase to one 1 mg tablet twice daily. 05/06/17   Forrest Moron, MD    Past Medical History:  Diagnosis Date  . GERD (gastroesophageal reflux disease)   . Heart murmur    as a child  . Herpes genitalis in women 2005  . Hyperlipidemia   . Hypertension   . Menopause   . Osteopenia     Past Surgical History:  Procedure Laterality Date  . COLONOSCOPY  2017  . GANGLION CYST EXCISION  1970  . PAROTID GLAND TUMOR EXCISION  2005  . POLYPECTOMY  2017   colon    Social History    Tobacco Use  . Smoking status: Current Every Day Smoker    Packs/day: 0.50    Types: Cigarettes  . Smokeless tobacco: Never Used  Substance Use Topics  . Alcohol use: Yes    Alcohol/week: 2.0 standard drinks    Types: 2 Glasses of wine per week    Comment: 2 glasses of wine daily    Family History  Problem Relation Age of Onset  . Hypertension Father   . Heart disease Father   . Brain cancer Mother   . Hypertension Brother   . Cancer Brother   . Hypertension Maternal Grandfather   . Heart disease Paternal Grandfather   . Colon cancer Neg Hx   . Diabetes Neg Hx   . Stomach cancer Neg Hx   . Rectal cancer Neg Hx   . Colon polyps Neg Hx   . Esophageal cancer Neg Hx     Review of Systems  Constitutional: Negative for chills, fever and malaise/fatigue.  HENT: Positive for hearing loss. Negative for tinnitus.   Eyes: Negative for blurred vision and double vision.  Respiratory: Negative for cough, shortness of breath and wheezing.   Cardiovascular: Positive for palpitations. Negative for chest pain and leg swelling.  Gastrointestinal: Negative for abdominal pain, blood in stool, constipation, diarrhea, heartburn (Take tums, works well), nausea and vomiting.  Genitourinary: Negative for dysuria, frequency and hematuria.  Musculoskeletal: Negative for back pain and joint pain.  Skin: Negative for rash.  Neurological: Negative for dizziness, tingling, weakness and headaches.     OBJECTIVE:  Today's Vitals   12/20/20 1358  BP: (!) 159/67  Pulse: 82  Temp: (!) 97.3 F (36.3 C)  SpO2: 98%  Weight: 112 lb (50.8 kg)  Height: 5' 0.5" (1.537 m)   Body mass index is 21.51 kg/m.   Physical Exam Constitutional:      General: She is not in acute distress.    Appearance: Normal appearance. She is not ill-appearing.  HENT:     Head: Normocephalic.     Right Ear: Tympanic membrane, ear canal and external ear normal. There is no impacted cerumen.     Left Ear: Tympanic  membrane, ear canal and external ear normal. There is no impacted cerumen.  Cardiovascular:     Rate and Rhythm: Normal rate and regular rhythm.     Pulses: Normal pulses.     Heart sounds: Normal heart sounds. No murmur heard. No friction rub. No gallop.  Pulmonary:     Effort: Pulmonary effort is normal. No respiratory distress.     Breath sounds: Normal breath sounds. No stridor. No wheezing, rhonchi or rales.  Abdominal:     General: Bowel sounds are normal.     Palpations: Abdomen is soft.     Tenderness: There is no abdominal tenderness.  Musculoskeletal:     Right lower leg: No edema.     Left lower leg: No edema.  Skin:    General: Skin is warm and dry.  Neurological:     Mental Status: She is alert and oriented to person, place, and time.  Psychiatric:        Mood and Affect: Mood normal.        Behavior: Behavior normal.     No results found for this or any previous visit (from the past 24 hour(s)).  No results found.   ASSESSMENT and PLAN  Problem List Items Addressed This Visit      Cardiovascular and Mediastinum   Essential hypertension BP not at goal< 130/80: BP: (!) 159/67  Take 2 HCTZ per day (25 mg total) If  By January 4th your home BP is greater than 130/80 5 out of 7 days of the week Start taking 2 Amlodipine (10mg ) per day.  Please call the clinic to update.     Other   Other hyperlipidemia - Primary Continue daily crestor        Return in about 3 months (around 03/20/2021).   Huston Foley Kamal Jurgens, FNP-BC [facility] [place], [place] [address] Ph.  [phone] Fax [phone]

## 2020-12-22 ENCOUNTER — Other Ambulatory Visit: Payer: Self-pay

## 2020-12-22 ENCOUNTER — Encounter: Payer: Self-pay | Admitting: Internal Medicine

## 2020-12-22 ENCOUNTER — Ambulatory Visit (AMBULATORY_SURGERY_CENTER): Payer: Medicare HMO | Admitting: Internal Medicine

## 2020-12-22 VITALS — BP 108/54 | HR 58 | Temp 97.9°F | Resp 10 | Ht 61.5 in | Wt 113.5 lb

## 2020-12-22 DIAGNOSIS — Z86010 Personal history of colonic polyps: Secondary | ICD-10-CM

## 2020-12-22 MED ORDER — SODIUM CHLORIDE 0.9 % IV SOLN: 500.0000 mL | Freq: Once | INTRAVENOUS | Status: DC

## 2020-12-22 NOTE — Progress Notes (Signed)
PT taken to PACU. Monitors in place. VSS. Report given to RN. 

## 2020-12-22 NOTE — Patient Instructions (Signed)
Handout provided on diverticulosis.   Repeat colonoscopy in 5 years for surveillance (personal history of polyps).   YOU HAD AN ENDOSCOPIC PROCEDURE TODAY AT [facility]:   Refer to the procedure report that was given to you for any specific questions about what was found during the examination.  If the procedure report does not answer your questions, please call your gastroenterologist to clarify.  If you requested that your care partner not be given the details of your procedure findings, then the procedure report has been included in a sealed envelope for you to review at your convenience later.  YOU SHOULD EXPECT: Some feelings of bloating in the abdomen. Passage of more gas than usual.  Walking can help get rid of the air that was put into your GI tract during the procedure and reduce the bloating. If you had a lower endoscopy (such as a colonoscopy or flexible sigmoidoscopy) you may notice spotting of blood in your stool or on the toilet paper. If you underwent a bowel prep for your procedure, you may not have a normal bowel movement for a few days.  Please Note:  You might notice some irritation and congestion in your nose or some drainage.  This is from the oxygen used during your procedure.  There is no need for concern and it should clear up in a day or so.  SYMPTOMS TO REPORT IMMEDIATELY:   Following lower endoscopy (colonoscopy or flexible sigmoidoscopy):  Excessive amounts of blood in the stool  Significant tenderness or worsening of abdominal pains  Swelling of the abdomen that is new, acute  Fever of 100F or higher  For urgent or emergent issues, a gastroenterologist can be reached at any hour by calling [phone]. Do not use MyChart messaging for urgent concerns.    DIET:  We do recommend a small meal at first, but then you may proceed to your regular diet.  Drink plenty of fluids but you should avoid alcoholic beverages for 24 hours.  ACTIVITY:  You  should plan to take it easy for the rest of today and you should NOT DRIVE or use heavy machinery until tomorrow (because of the sedation medicines used during the test).    FOLLOW UP: Our staff will call the number listed on your records 48-72 hours following your procedure to check on you and address any questions or concerns that you may have regarding the information given to you following your procedure. If we do not reach you, we will leave a message.  We will attempt to reach you two times.  During this call, we will ask if you have developed any symptoms of COVID 19. If you develop any symptoms (ie: fever, flu-like symptoms, shortness of breath, cough etc.) before then, please call [phone].  If you test positive for Covid 19 in the 2 weeks post procedure, please call and report this information to [place].    If any biopsies were taken you will be contacted by phone or by letter within the next 1-3 weeks.  Please call us at [phone] if you have not heard about the biopsies in 3 weeks.    SIGNATURES/CONFIDENTIALITY: You and/or your care partner have signed paperwork which will be entered into your electronic medical record.  These signatures attest to the fact that that the information above on your After Visit Summary has been reviewed and is understood.  Full responsibility of the confidentiality of this discharge information lies with you and/or your care-partner.

## 2020-12-22 NOTE — Progress Notes (Signed)
Medical history reviewed with no changes noted. VS assessed by C.W 

## 2020-12-22 NOTE — Op Note (Signed)
[place] Patient Name: Anita Calderon Procedure Date: 12/22/2020 9:08 AM MRN: [id number] Endoscopist: Docia Chuck. Henrene Pastor , MD Age: 69 Referring MD:  Date of Birth: Oct 25, 1951 Gender: Female Account #: [id number] Procedure:                Colonoscopy Indications:              High risk colon cancer surveillance: Personal                            history of adenoma (10 mm or greater in size), High                            risk colon cancer surveillance: Personal history of                            multiple (3 or more) adenomas. Previous examination                            February 2017 Medicines:                Monitored Anesthesia Care Procedure:                Pre-Anesthesia Assessment:                           - Prior to the procedure, a History and Physical                            was performed, and patient medications and                            allergies were reviewed. The patient's tolerance of                            previous anesthesia was also reviewed. The risks                            and benefits of the procedure and the sedation                            options and risks were discussed with the patient.                            All questions were answered, and informed consent                            was obtained. Prior Anticoagulants: The patient has                            taken no previous anticoagulant or antiplatelet                            agents. ASA Grade Assessment: II - A patient with  mild systemic disease. After reviewing the risks                            and benefits, the patient was deemed in                            satisfactory condition to undergo the procedure.                           After obtaining informed consent, the colonoscope                            was passed under direct vision. Throughout the                            procedure, the patient's blood pressure, pulse, and                             oxygen saturations were monitored continuously. The                            Olympus CF-HQ190L (Serial# [id number]) Colonoscope was                            introduced through the anus and advanced to the the                            cecum, identified by appendiceal orifice and                            ileocecal valve. The ileocecal valve, appendiceal                            orifice, and rectum were photographed. The quality                            of the bowel preparation was good. The colonoscopy                            was performed without difficulty. The patient                            tolerated the procedure well. The bowel preparation                            used was SUPREP via split dose instruction. Scope In: 9:28:31 AM Scope Out: 9:46:20 AM Scope Withdrawal Time: 0 hours 11 minutes 21 seconds  Total Procedure Duration: 0 hours 17 minutes 49 seconds  Findings:                 Multiple diverticula were found in the sigmoid                            colon.  The exam was otherwise without abnormality on                            direct and retroflexion views. Complications:            No immediate complications. Estimated blood loss:                            None. Estimated Blood Loss:     Estimated blood loss: none. Impression:               - Diverticulosis in the sigmoid colon.                           - The examination was otherwise normal on direct                            and retroflexion views.                           - No specimens collected. Recommendation:           - Repeat colonoscopy in 5 years for surveillance                            (personal history).                           - Patient has a contact number available for                            emergencies. The signs and symptoms of potential                            delayed complications were discussed with the                             patient. Return to normal activities tomorrow.                            Written discharge instructions were provided to the                            patient.                           - Resume previous diet.                           - Continue present medications. Docia Chuck. Henrene Pastor, MD 12/22/2020 9:52:34 AM This report has been signed electronically.

## 2020-12-26 ENCOUNTER — Telehealth: Payer: Self-pay

## 2020-12-26 NOTE — Telephone Encounter (Signed)
°  Follow up Call-  Call back number 12/22/2020  Post procedure Call Back phone  # [phone]  Permission to leave phone message Yes  Some recent data might be hidden     Patient questions:  Do you have a fever, pain , or abdominal swelling? No. Pain Score  0 *  Have you tolerated food without any problems? Yes.    Have you been able to return to your normal activities? Yes.    Do you have any questions about your discharge instructions: Diet   No. Medications  No. Follow up visit  No.  Do you have questions or concerns about your Care? No.  Actions: * If pain score is 4 or above: No action needed, pain <4.  1. Have you developed a fever since your procedure? no  2.   Have you had an respiratory symptoms (SOB or cough) since your procedure? no  3.   Have you tested positive for COVID 19 since your procedure no  4.   Have you had any family members/close contacts diagnosed with the COVID 19 since your procedure?  no   If yes to any of these questions please route to Laverna Peace, RN and Karlton Lemon, RN

## 2021-02-02 ENCOUNTER — Ambulatory Visit: Payer: Medicare HMO

## 2021-03-20 ENCOUNTER — Ambulatory Visit (INDEPENDENT_AMBULATORY_CARE_PROVIDER_SITE_OTHER): Payer: Medicare HMO | Admitting: Family Medicine

## 2021-03-20 ENCOUNTER — Other Ambulatory Visit: Payer: Self-pay

## 2021-03-20 ENCOUNTER — Encounter: Payer: Self-pay | Admitting: Family Medicine

## 2021-03-20 DIAGNOSIS — Z13220 Encounter for screening for lipoid disorders: Secondary | ICD-10-CM | POA: Diagnosis not present

## 2021-03-20 DIAGNOSIS — I10 Essential (primary) hypertension: Secondary | ICD-10-CM | POA: Diagnosis not present

## 2021-03-20 MED ORDER — AMLODIPINE BESYLATE 10 MG PO TABS: 10.0000 mg | ORAL_TABLET | Freq: Every day | ORAL | 3 refills | Status: AC

## 2021-03-20 MED ORDER — ROSUVASTATIN CALCIUM 10 MG PO TABS: 10.0000 mg | ORAL_TABLET | Freq: Every day | ORAL | 3 refills | Status: AC

## 2021-03-20 MED ORDER — HYDROCHLOROTHIAZIDE 25 MG PO TABS: 25.0000 mg | ORAL_TABLET | Freq: Every day | ORAL | 3 refills | Status: AC

## 2021-03-20 NOTE — Patient Instructions (Addendum)
RE: MyChart  Dear Anita Calderon  MyChart makes it easy for you to view your health information - all in one secure location - from any computer or mobile device at any time. Use the activation code below to enroll in MyChart online at [email or web address]   Once your account is activated, you can:  Marland Kitchen View your test results. . Communicate securely with your physician's office.  . View your medical history, allergies, medications, and immunizations. . Receive care virtually through an e-Visit.   If you are over 18, you may use features of MyChart to manage the health information of your spouse, children or others you care for.  Download child and adult access forms at [email or web address].    As you activate your MyChart account and need any technical assistance, please call the MyChart technical support line at [phone]).  Be sure to also download the MyChart app for your mobile device.   Thank you for choosing [facility] for your family's health care needs!   MyChart Activation Code:  Activation code not generated Current MyChart Status: Active             [facility]  [place], [place] [address]  Health Maintenance After Age 56 After age 8, you are at a higher risk for certain long-term diseases and infections as well as injuries from falls. Falls are a major cause of broken bones and head injuries in people who are older than age 56. Getting regular preventive care can help to keep you healthy and well. Preventive care includes getting regular testing and making lifestyle changes as recommended by your health care provider. Talk with your health care provider about:  Which screenings and tests you should have. A screening is a test that checks for a disease when you have no symptoms.  A diet and exercise plan that is right for you. What should I know about screenings and tests to prevent falls? Screening and testing  are the best ways to find a health problem early. Early diagnosis and treatment give you the best chance of managing medical conditions that are common after age 75. Certain conditions and lifestyle choices may make you more likely to have a fall. Your health care provider may recommend:  Regular vision checks. Poor vision and conditions such as cataracts can make you more likely to have a fall. If you wear glasses, make sure to get your prescription updated if your vision changes.  Medicine review. Work with your health care provider to regularly review all of the medicines you are taking, including over-the-counter medicines. Ask your health care provider about any side effects that may make you more likely to have a fall. Tell your health care provider if any medicines that you take make you feel dizzy or sleepy.  Osteoporosis screening. Osteoporosis is a condition that causes the bones to get weaker. This can make the bones weak and cause them to break more easily.  Blood pressure screening. Blood pressure changes and medicines to control blood pressure can make you feel dizzy.  Strength and balance checks. Your health care provider may recommend certain tests to check your strength and balance while standing, walking, or changing positions.  Foot health exam. Foot pain and numbness, as well as not wearing proper footwear, can make you more likely to have a fall.  Depression screening. You may be more likely to have a fall if you have a fear of falling, feel emotionally  low, or feel unable to do activities that you used to do.  Alcohol use screening. Using too much alcohol can affect your balance and may make you more likely to have a fall. What actions can I take to lower my risk of falls? General instructions  Talk with your health care provider about your risks for falling. Tell your health care provider if: ? You fall. Be sure to tell your health care provider about all falls, even ones  that seem minor. ? You feel dizzy, sleepy, or off-balance.  Take over-the-counter and prescription medicines only as told by your health care provider. These include any supplements.  Eat a healthy diet and maintain a healthy weight. A healthy diet includes low-fat dairy products, low-fat (lean) meats, and fiber from whole grains, beans, and lots of fruits and vegetables. Home safety  Remove any tripping hazards, such as rugs, cords, and clutter.  Install safety equipment such as grab bars in bathrooms and safety rails on stairs.  Keep rooms and walkways well-lit. Activity  Follow a regular exercise program to stay fit. This will help you maintain your balance. Ask your health care provider what types of exercise are appropriate for you.  If you need a cane or walker, use it as recommended by your health care provider.  Wear supportive shoes that have nonskid soles.   Lifestyle  Do not drink alcohol if your health care provider tells you not to drink.  If you drink alcohol, limit how much you have: ? 0-1 drink a day for women. ? 0-2 drinks a day for men.  Be aware of how much alcohol is in your drink. In the [place] one drink equals one typical bottle of beer (12 oz), one-half glass of wine (5 oz), or one shot of hard liquor (1 oz).  Do not use any products that contain nicotine or tobacco, such as cigarettes and e-cigarettes. If you need help quitting, ask your health care provider. Summary  Having a healthy lifestyle and getting preventive care can help to protect your health and wellness after age 25.  Screening and testing are the best way to find a health problem early and help you avoid having a fall. Early diagnosis and treatment give you the best chance for managing medical conditions that are more common for people who are older than age 60.  Falls are a major cause of broken bones and head injuries in people who are older than age 74. Take precautions to prevent a fall  at home.  Work with your health care provider to learn what changes you can make to improve your health and wellness and to prevent falls. This information is not intended to replace advice given to you by your health care provider. Make sure you discuss any questions you have with your health care provider. Document Revised: 04/09/2019 Document Reviewed: 10/30/2017 Elsevier Patient Education  2021 [employer].  If you have lab work done today you will be contacted with your lab results within the next 2 weeks.  If you have not heard from [place] then please contact us. The fastest way to get your results is to register for My Chart.   IF you received an x-ray today, you will receive an invoice from [facility]. Please contact [facility] at [phone] with questions or concerns regarding your invoice.   IF you received labwork today, you will receive an invoice from [facility]. Please contact [facility] at [phone] with questions or concerns regarding your invoice.  Our billing staff will not be able to assist you with questions regarding bills from these companies.  You will be contacted with the lab results as soon as they are available. The fastest way to get your results is to activate your My Chart account. Instructions are located on the last page of this paperwork. If you have not heard from [place] regarding the results in 2 weeks, please contact this office.

## 2021-03-20 NOTE — Progress Notes (Signed)
3/21/202210:20 AM  Anita Calderon 06/16/1951, 70 y.o., female [id number]  Chief Complaint  Patient presents with  . Hypertension    Usually 131/71 at home - currently taking 2 5 mg tabs , needs new prescription     HPI:   Patient is a 70 y.o. female with past medical history significant for HTN who presents today for HTN f/u.  HTN Was previously on valsartan and losartan, amlodipine,  Atorvastatin, lisinopril,  Currently on Amlodipine 10mg  daily, increased last OV HCTZ 25 daily, inc. Last OV BP goal  < 130/80 BP at home 131/71 Take 2 HCTZ per day (25 mg total)  BP Readings from Last 3 Encounters:  03/20/21 (!) 150/72  12/22/20 (!) 108/54  12/20/20 (!) 159/67   HLD Crestor 10mg  daily Lab Results  Component Value Date   CHOL 232 (H) 12/06/2020   HDL 85 12/06/2020   LDLCALC 134 (H) 12/06/2020   TRIG 76 12/06/2020   CHOLHDL 2.7 12/06/2020   The 10-year ASCVD risk score Mikey Bussing DC Jr., et al., 2013) is: 27.7%   Values used to calculate the score:     Age: 36 years     Sex: Female     Is Non-Hispanic African American: No     Diabetic: No     Tobacco smoker: Yes     Systolic Blood Pressure: 361 mmHg     Is BP treated: Yes     HDL Cholesterol: 85 mg/dL     Total Cholesterol: 232 mg/dL   Needs 2nd shingrix  Current smoker: low dose CT ordered 12/21 Health Maintenance  Topic Date Due  . COVID-19 Vaccine (3 - Booster for Pfizer series) 12/24/2020  . INFLUENZA VACCINE  03/30/2021 (Originally 07/31/2020)  . DEXA SCAN  12/06/2021 (Originally 03/29/2014)  . Hepatitis C Screening  12/06/2021 (Originally July 22, 1951)  . MAMMOGRAM  12/14/2022  . COLONOSCOPY (Pts 45-29yrs Insurance coverage will need to be confirmed)  12/22/2025  . TETANUS/TDAP  04/25/2027  . PNA vac Low Risk Adult  Completed  . HPV VACCINES  Aged Out     Depression screen [facility] 2/9 03/20/2021 12/20/2020 12/06/2020  Decreased Interest 0 0 0  Down, Depressed, Hopeless 0 0 0  PHQ - 2 Score 0 0 0    Fall Risk   03/20/2021 12/20/2020 12/06/2020 09/28/2017 04/24/2017  Falls in the past year? 0 0 0 Yes No  Number falls in past yr: 0 0 0 1 -  Injury with Fall? 0 0 0 Yes -  Follow up Falls evaluation completed Falls evaluation completed Falls evaluation completed - -     Allergies  Allergen Reactions  . Ciprofloxacin     REACTION: hives    Prior to Admission medications   Medication Sig Start Date End Date Taking? Authorizing Provider  amoxicillin-clavulanate (AUGMENTIN) 875-125 MG tablet Take 1 tablet by mouth 2 (two) times daily. One po bid x 7 days 07/25/20   Charlesetta Shanks, MD  aspirin 81 MG tablet Take 81 mg by mouth daily.    [provider]  Cetirizine HCl (ZYRTEC ALLERGY) 10 MG CAPS Take 1 capsule (10 mg total) by mouth daily as needed. 04/24/17   Forrest Moron, MD  fluticasone (FLONASE) 50 MCG/ACT nasal spray Place 2 sprays into both nostrils daily. 04/24/17 07/23/17  Forrest Moron, MD  HYDROcodone-acetaminophen (NORCO/VICODIN) 5-325 MG tablet Take 1-2 tablets by mouth every 6 (six) hours as needed for moderate pain or severe pain. 07/25/20   Charlesetta Shanks, MD  losartan (  COZAAR) 100 MG tablet Take 1 tablet (100 mg total) by mouth daily. 08/05/17   Forrest Moron, MD  mupirocin ointment (BACTROBAN) 2 % Place 1 application into the nose 2 (two) times daily. 09/28/17   Rutherford Guys, MD  omeprazole (PRILOSEC) 20 MG capsule Take 1 capsule (20 mg total) by mouth 2 (two) times daily before a meal. 07/25/20   Charlesetta Shanks, MD  ondansetron (ZOFRAN ODT) 4 MG disintegrating tablet Take 1 tablet (4 mg total) by mouth every 4 (four) hours as needed for nausea or vomiting. 07/25/20   Charlesetta Shanks, MD  varenicline (CHANTIX STARTING MONTH PAK) 0.5 MG X 11 & 1 MG X 42 tablet Take one 0.5 mg tablet by mouth once daily for 3 days, then increase to one 0.5 mg tablet twice daily for 4 days, then increase to one 1 mg tablet twice daily. 05/06/17   Forrest Moron, MD    Past Medical History:   Diagnosis Date  . GERD (gastroesophageal reflux disease)   . Heart murmur    as a child  . Herpes genitalis in women 2005  . Hyperlipidemia   . Hypertension   . Menopause   . Osteopenia     Past Surgical History:  Procedure Laterality Date  . COLONOSCOPY  2017  . GANGLION CYST EXCISION  1970  . PAROTID GLAND TUMOR EXCISION  2005  . POLYPECTOMY  2017   colon    Social History   Tobacco Use  . Smoking status: Current Every Day Smoker    Packs/day: 0.50    Types: Cigarettes  . Smokeless tobacco: Never Used  Substance Use Topics  . Alcohol use: Yes    Alcohol/week: 2.0 standard drinks    Types: 2 Glasses of wine per week    Comment: 2 glasses of wine daily    Family History  Problem Relation Age of Onset  . Hypertension Father   . Heart disease Father   . Brain cancer Mother   . Hypertension Brother   . Cancer Brother   . Hypertension Maternal Grandfather   . Heart disease Paternal Grandfather   . Colon cancer Neg Hx   . Diabetes Neg Hx   . Stomach cancer Neg Hx   . Rectal cancer Neg Hx   . Colon polyps Neg Hx   . Esophageal cancer Neg Hx     Review of Systems  Constitutional: Negative for chills, fever and malaise/fatigue.  HENT: Positive for hearing loss. Negative for tinnitus.   Eyes: Negative for blurred vision and double vision.  Respiratory: Negative for cough, shortness of breath and wheezing.   Cardiovascular: Negative for chest pain, palpitations and leg swelling.  Gastrointestinal: Positive for heartburn (Take tums, works well). Negative for abdominal pain, blood in stool, constipation, diarrhea, nausea and vomiting.  Genitourinary: Negative for dysuria, frequency and hematuria.  Musculoskeletal: Negative for back pain and joint pain.  Skin: Negative for rash.  Neurological: Negative for dizziness, tingling, weakness and headaches.    OBJECTIVE:  Today's Vitals   03/20/21 0952  BP: (!) 150/72  Pulse: 76  Temp: 97.8 F (36.6 C)  SpO2:  100%  Weight: 114 lb (51.7 kg)  Height: 5' 1.5" (1.562 m)   Body mass index is 21.19 kg/m.   Physical Exam Constitutional:      General: She is not in acute distress.    Appearance: Normal appearance. She is not ill-appearing.  HENT:     Head: Normocephalic.  Cardiovascular:  Rate and Rhythm: Normal rate and regular rhythm.     Pulses: Normal pulses.     Heart sounds: Normal heart sounds. No murmur heard. No friction rub. No gallop.   Pulmonary:     Effort: Pulmonary effort is normal. No respiratory distress.     Breath sounds: Normal breath sounds. No stridor. No wheezing, rhonchi or rales.  Abdominal:     General: Bowel sounds are normal.     Palpations: Abdomen is soft.     Tenderness: There is no abdominal tenderness.  Musculoskeletal:     Right lower leg: No edema.     Left lower leg: No edema.  Skin:    General: Skin is warm and dry.  Neurological:     Mental Status: She is alert and oriented to person, place, and time.  Psychiatric:        Mood and Affect: Mood normal.        Behavior: Behavior normal.     No results found for this or any previous visit (from the past 24 hour(s)).  No results found.   ASSESSMENT and PLAN  Problem List Items Addressed This Visit      Cardiovascular and Mediastinum   Essential hypertension   Relevant Medications   amLODipine (NORVASC) 10 MG tablet   hydrochlorothiazide (HYDRODIURIL) 25 MG tablet   rosuvastatin (CRESTOR) 10 MG tablet    Other Visit Diagnoses    Screening cholesterol level       Relevant Medications   rosuvastatin (CRESTOR) 10 MG tablet           Plan  Encouraged to get 2nd shingrix  Medication refills sent  Continue to monitor BP at home for goal< 130/80   Return in about 6 months (around 09/20/2021).   Huston Foley Hillarie Harrigan, FNP-BC [facility] [place], [place] [address] Ph.  [phone] Fax [phone]

## 2021-03-24 DIAGNOSIS — H35363 Drusen (degenerative) of macula, bilateral: Secondary | ICD-10-CM | POA: Diagnosis not present

## 2021-03-24 DIAGNOSIS — H524 Presbyopia: Secondary | ICD-10-CM | POA: Diagnosis not present

## 2021-03-24 DIAGNOSIS — H25013 Cortical age-related cataract, bilateral: Secondary | ICD-10-CM | POA: Diagnosis not present

## 2021-03-24 DIAGNOSIS — H2513 Age-related nuclear cataract, bilateral: Secondary | ICD-10-CM | POA: Diagnosis not present

## 2021-03-24 DIAGNOSIS — H04123 Dry eye syndrome of bilateral lacrimal glands: Secondary | ICD-10-CM | POA: Diagnosis not present

## 2021-04-03 DIAGNOSIS — E785 Hyperlipidemia, unspecified: Secondary | ICD-10-CM | POA: Diagnosis not present

## 2021-04-03 DIAGNOSIS — Z8249 Family history of ischemic heart disease and other diseases of the circulatory system: Secondary | ICD-10-CM | POA: Diagnosis not present

## 2021-04-03 DIAGNOSIS — K219 Gastro-esophageal reflux disease without esophagitis: Secondary | ICD-10-CM | POA: Diagnosis not present

## 2021-04-03 DIAGNOSIS — Z809 Family history of malignant neoplasm, unspecified: Secondary | ICD-10-CM | POA: Diagnosis not present

## 2021-04-03 DIAGNOSIS — R69 Illness, unspecified: Secondary | ICD-10-CM | POA: Diagnosis not present

## 2021-04-03 DIAGNOSIS — Z881 Allergy status to other antibiotic agents status: Secondary | ICD-10-CM | POA: Diagnosis not present

## 2021-04-03 DIAGNOSIS — I10 Essential (primary) hypertension: Secondary | ICD-10-CM | POA: Diagnosis not present

## 2021-05-20 IMAGING — MG DIGITAL SCREENING BILAT W/ TOMO W/ CAD
8 series · 9 of 24 positions shown · non-contrast
Comparison: Previous exam(s).

CLINICAL DATA: Screening.

EXAM:
DIGITAL SCREENING BILATERAL MAMMOGRAM WITH TOMO AND CAD

[R MLO synth-2D]
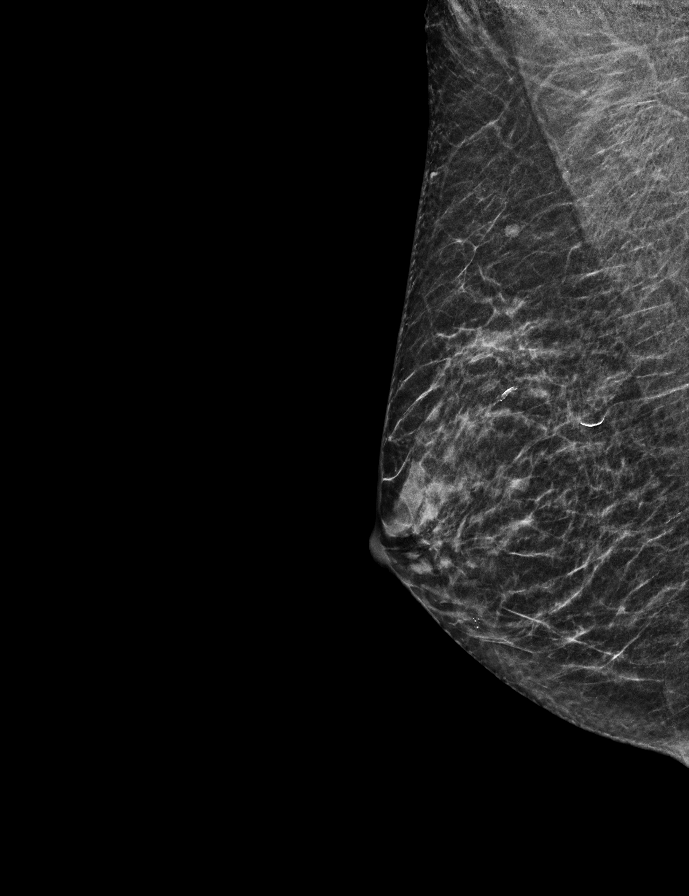

[L CC synth-2D]
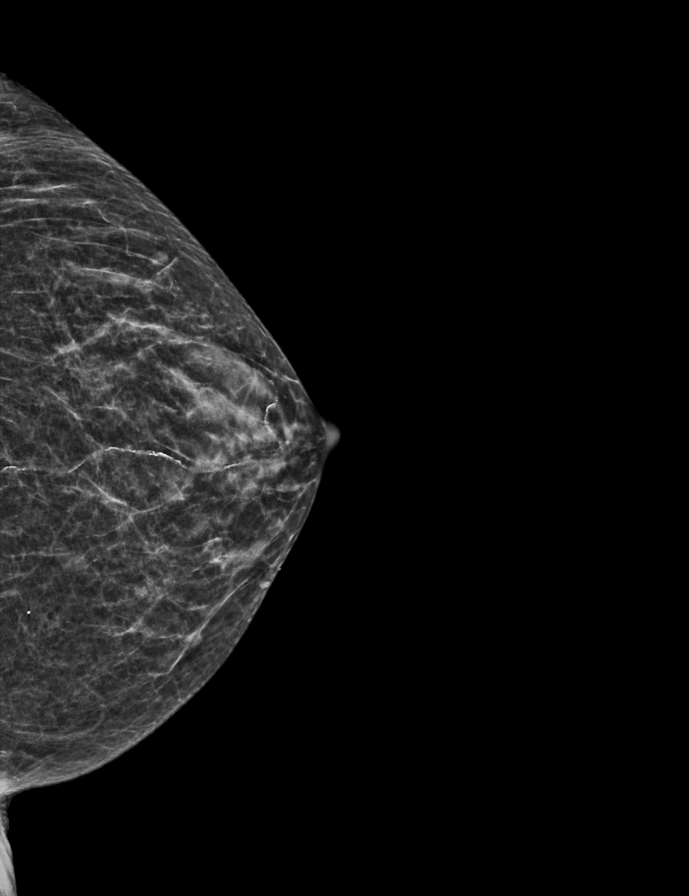

[R CC synth-2D]
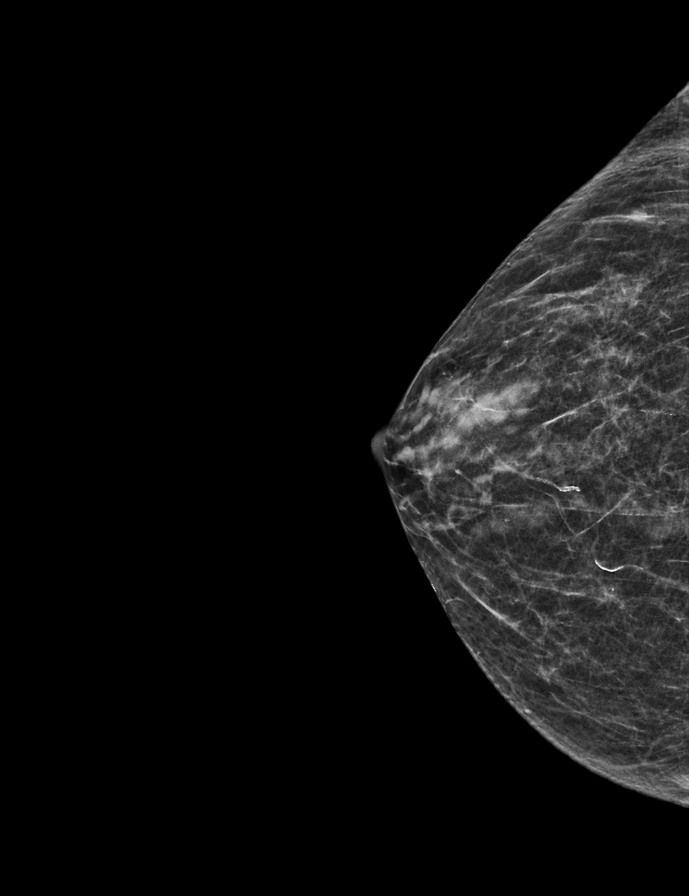

[L MLO synth-2D]
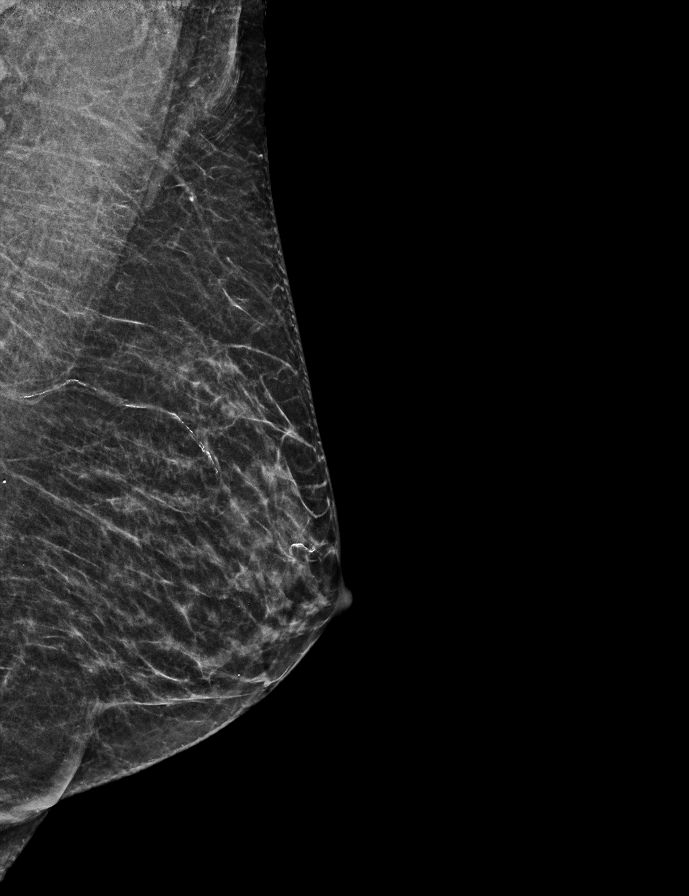

[R MLO tomo · 2 of 36 frames shown]
[frame 12/36]
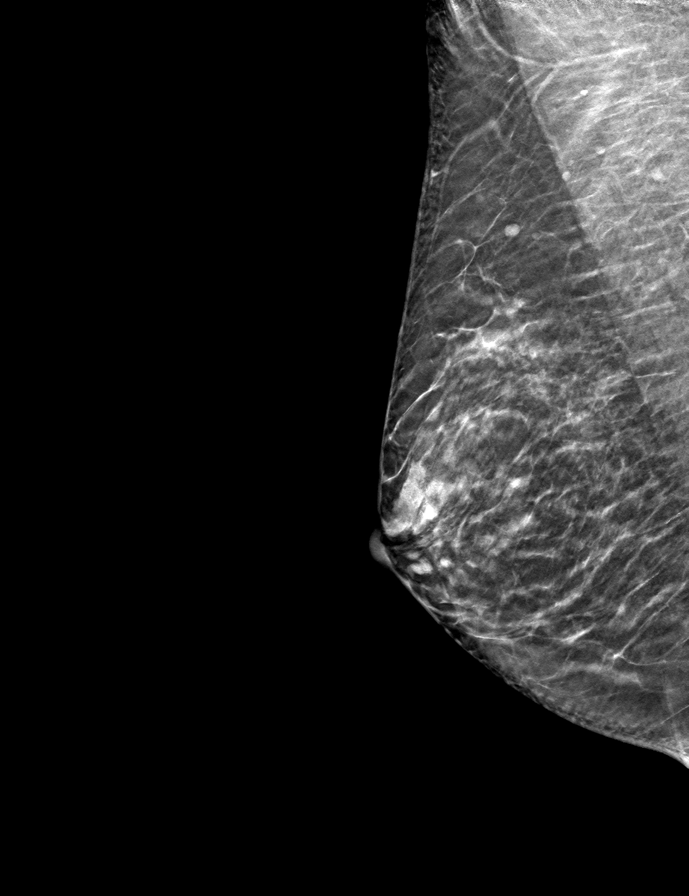
[frame 19/36]
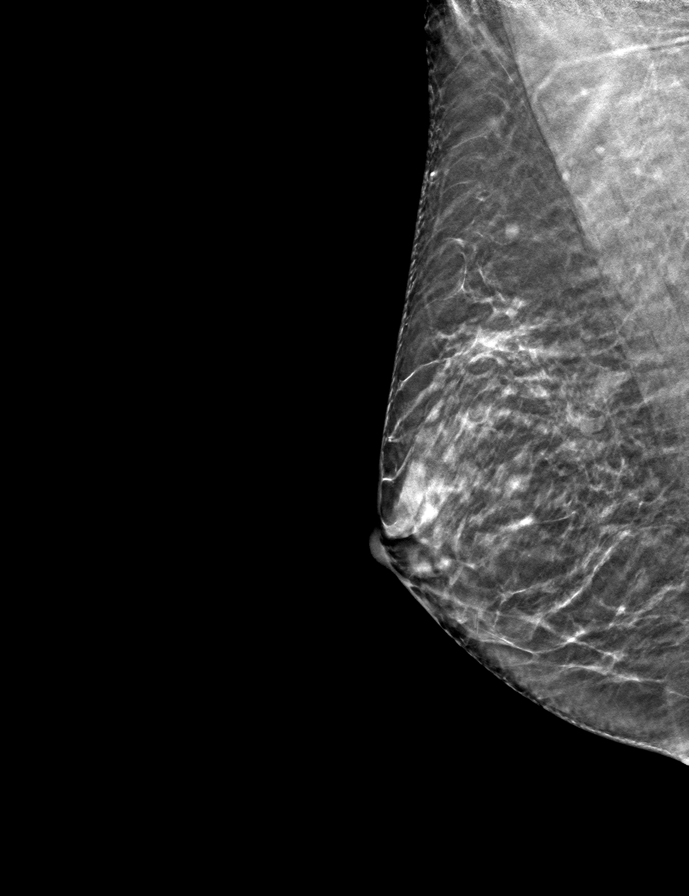

[R CC tomo · tomo slice 20/39.0]
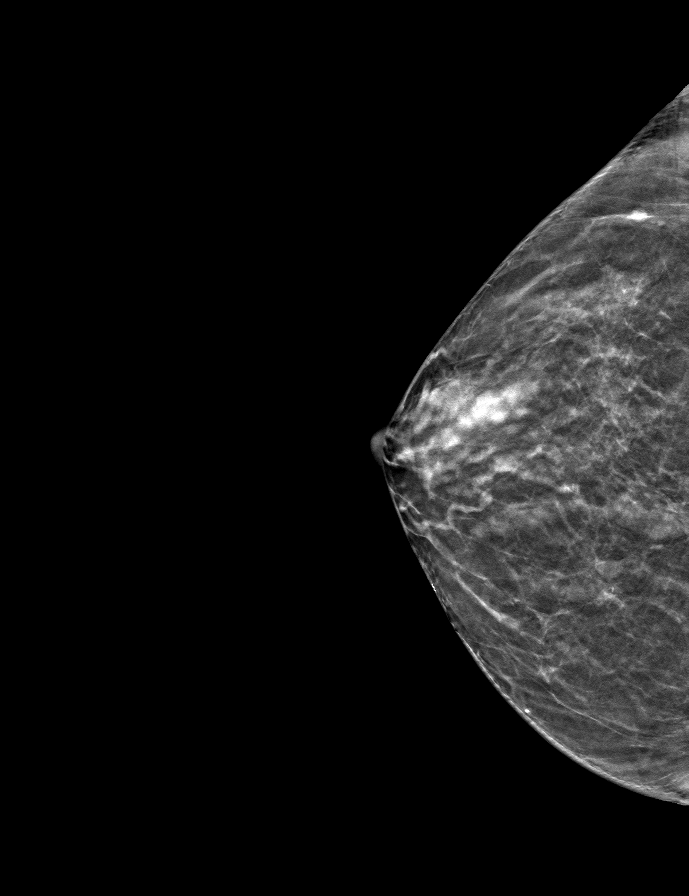

[L MLO tomo · tomo slice 21/40.0]
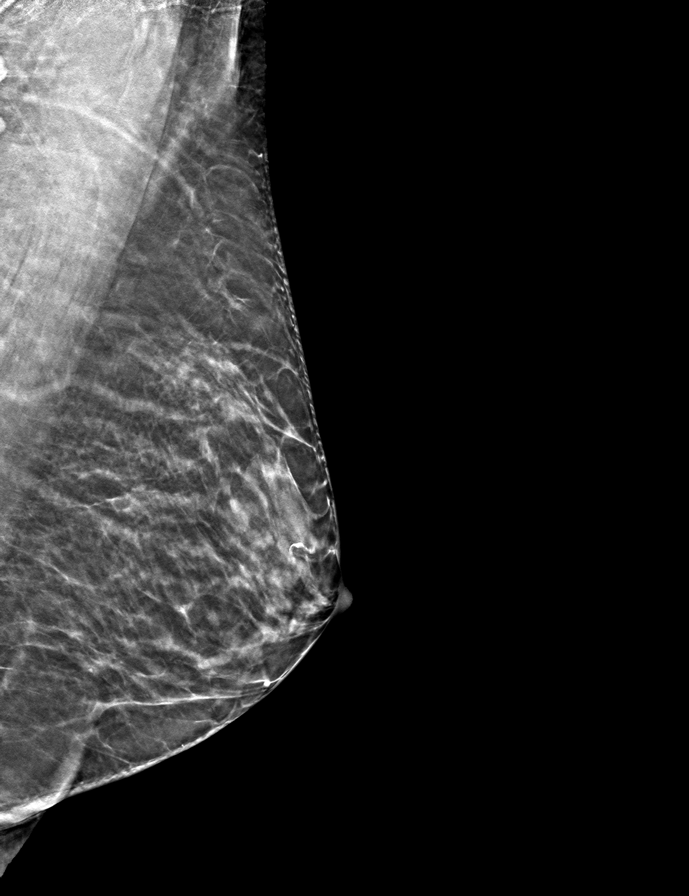

[L CC tomo · tomo slice 20/39.0]
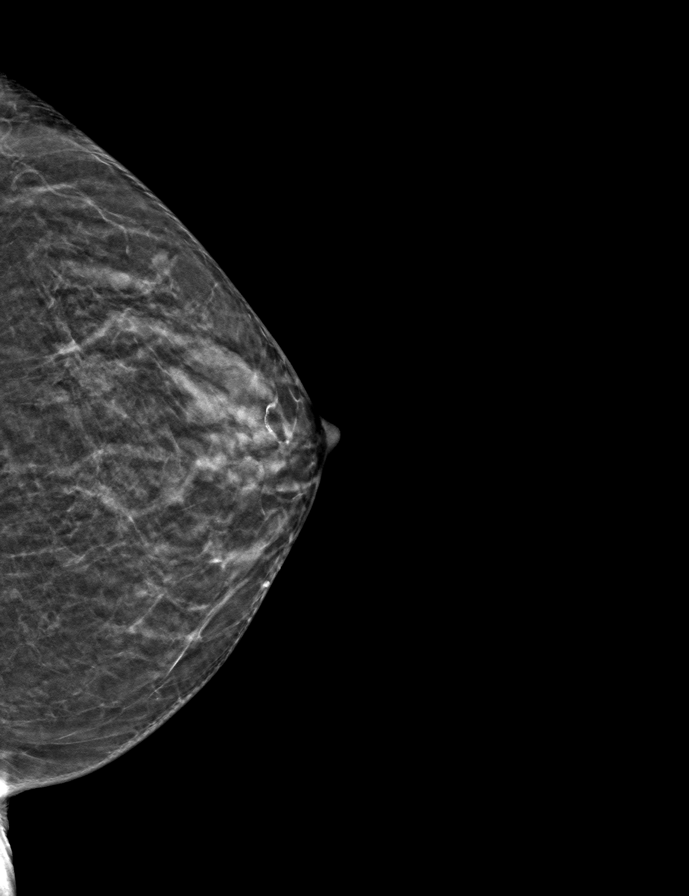

[9 of 24 positions shown; findings below may reference images not displayed]

ACR Breast Density Category b: There are scattered areas of
fibroglandular density.
FINDINGS: There are no findings suspicious for malignancy. Images were
processed with CAD.
IMPRESSION: No mammographic evidence of malignancy. A result letter of this
screening mammogram will be mailed directly to the patient.

RECOMMENDATION:
Screening mammogram in one year. (Code:[id number])

BI-RADS CATEGORY  1: Negative.

## 2021-08-29 DIAGNOSIS — E782 Mixed hyperlipidemia: Secondary | ICD-10-CM | POA: Diagnosis not present

## 2021-08-29 DIAGNOSIS — Z7689 Persons encountering health services in other specified circumstances: Secondary | ICD-10-CM | POA: Diagnosis not present

## 2021-08-29 DIAGNOSIS — I10 Essential (primary) hypertension: Secondary | ICD-10-CM | POA: Diagnosis not present

## 2021-11-06 DIAGNOSIS — R21 Rash and other nonspecific skin eruption: Secondary | ICD-10-CM | POA: Diagnosis not present

## 2021-11-06 DIAGNOSIS — L299 Pruritus, unspecified: Secondary | ICD-10-CM | POA: Diagnosis not present

## 2021-11-28 DIAGNOSIS — N760 Acute vaginitis: Secondary | ICD-10-CM | POA: Diagnosis not present

## 2021-11-28 DIAGNOSIS — N3000 Acute cystitis without hematuria: Secondary | ICD-10-CM | POA: Diagnosis not present

## 2021-12-21 DIAGNOSIS — Z0000 Encounter for general adult medical examination without abnormal findings: Secondary | ICD-10-CM | POA: Diagnosis not present

## 2021-12-27 DIAGNOSIS — I10 Essential (primary) hypertension: Secondary | ICD-10-CM | POA: Diagnosis not present

## 2021-12-27 DIAGNOSIS — E782 Mixed hyperlipidemia: Secondary | ICD-10-CM | POA: Diagnosis not present

## 2021-12-27 DIAGNOSIS — Z1211 Encounter for screening for malignant neoplasm of colon: Secondary | ICD-10-CM | POA: Diagnosis not present

## 2021-12-27 DIAGNOSIS — K5900 Constipation, unspecified: Secondary | ICD-10-CM | POA: Diagnosis not present

## 2021-12-27 DIAGNOSIS — Z0000 Encounter for general adult medical examination without abnormal findings: Secondary | ICD-10-CM | POA: Diagnosis not present

## 2021-12-27 DIAGNOSIS — Z23 Encounter for immunization: Secondary | ICD-10-CM | POA: Diagnosis not present

## 2022-02-14 DIAGNOSIS — E782 Mixed hyperlipidemia: Secondary | ICD-10-CM | POA: Diagnosis not present

## 2022-02-14 DIAGNOSIS — I10 Essential (primary) hypertension: Secondary | ICD-10-CM | POA: Diagnosis not present

## 2022-02-14 DIAGNOSIS — K5900 Constipation, unspecified: Secondary | ICD-10-CM | POA: Diagnosis not present

## 2022-12-26 DIAGNOSIS — Z13220 Encounter for screening for lipoid disorders: Secondary | ICD-10-CM | POA: Diagnosis not present

## 2022-12-26 DIAGNOSIS — Z0000 Encounter for general adult medical examination without abnormal findings: Secondary | ICD-10-CM | POA: Diagnosis not present

## 2022-12-26 DIAGNOSIS — R69 Illness, unspecified: Secondary | ICD-10-CM | POA: Diagnosis not present

## 2022-12-26 DIAGNOSIS — Z114 Encounter for screening for human immunodeficiency virus [HIV]: Secondary | ICD-10-CM | POA: Diagnosis not present

## 2023-01-01 ENCOUNTER — Other Ambulatory Visit: Payer: Self-pay | Admitting: Family Medicine

## 2023-01-01 DIAGNOSIS — Z1211 Encounter for screening for malignant neoplasm of colon: Secondary | ICD-10-CM

## 2023-01-01 DIAGNOSIS — Z1231 Encounter for screening mammogram for malignant neoplasm of breast: Secondary | ICD-10-CM

## 2023-01-01 DIAGNOSIS — K5900 Constipation, unspecified: Secondary | ICD-10-CM | POA: Diagnosis not present

## 2023-01-01 DIAGNOSIS — E782 Mixed hyperlipidemia: Secondary | ICD-10-CM | POA: Diagnosis not present

## 2023-01-01 DIAGNOSIS — Z23 Encounter for immunization: Secondary | ICD-10-CM | POA: Diagnosis not present

## 2023-01-01 DIAGNOSIS — Z0000 Encounter for general adult medical examination without abnormal findings: Secondary | ICD-10-CM | POA: Diagnosis not present

## 2023-01-01 DIAGNOSIS — I10 Essential (primary) hypertension: Secondary | ICD-10-CM | POA: Diagnosis not present

## 2023-03-26 DIAGNOSIS — Z0000 Encounter for general adult medical examination without abnormal findings: Secondary | ICD-10-CM | POA: Diagnosis not present

## 2023-03-26 DIAGNOSIS — Z1331 Encounter for screening for depression: Secondary | ICD-10-CM | POA: Diagnosis not present

## 2023-03-26 DIAGNOSIS — Z1339 Encounter for screening examination for other mental health and behavioral disorders: Secondary | ICD-10-CM | POA: Diagnosis not present

## 2023-03-26 DIAGNOSIS — I10 Essential (primary) hypertension: Secondary | ICD-10-CM | POA: Diagnosis not present

## 2023-03-26 DIAGNOSIS — K5900 Constipation, unspecified: Secondary | ICD-10-CM | POA: Diagnosis not present

## 2023-03-26 DIAGNOSIS — Z23 Encounter for immunization: Secondary | ICD-10-CM | POA: Diagnosis not present

## 2023-03-26 DIAGNOSIS — E782 Mixed hyperlipidemia: Secondary | ICD-10-CM | POA: Diagnosis not present

## 2023-03-26 DIAGNOSIS — E559 Vitamin D deficiency, unspecified: Secondary | ICD-10-CM | POA: Diagnosis not present

## 2023-03-27 ENCOUNTER — Other Ambulatory Visit: Payer: Self-pay | Admitting: Family Medicine

## 2023-03-27 DIAGNOSIS — Z13820 Encounter for screening for osteoporosis: Secondary | ICD-10-CM

## 2023-05-01 ENCOUNTER — Ambulatory Visit
Admission: RE | Admit: 2023-05-01 | Discharge: 2023-05-01 | Disposition: A | Discharge status: Home / Self Care | Payer: Medicare HMO | Source: Ambulatory Visit | Attending: Family Medicine | Admitting: Family Medicine

## 2023-05-01 DIAGNOSIS — Z1231 Encounter for screening mammogram for malignant neoplasm of breast: Secondary | ICD-10-CM

## 2023-05-03 ENCOUNTER — Other Ambulatory Visit: Payer: Self-pay | Admitting: Family Medicine

## 2023-05-03 DIAGNOSIS — R928 Other abnormal and inconclusive findings on diagnostic imaging of breast: Secondary | ICD-10-CM

## 2023-05-11 ENCOUNTER — Ambulatory Visit: Payer: Medicare HMO

## 2023-05-11 ENCOUNTER — Ambulatory Visit
Admission: RE | Admit: 2023-05-11 | Discharge: 2023-05-11 | Disposition: A | Discharge status: Home / Self Care | Payer: Medicare HMO | Source: Ambulatory Visit | Attending: Family Medicine | Admitting: Family Medicine

## 2023-05-11 DIAGNOSIS — R922 Inconclusive mammogram: Secondary | ICD-10-CM | POA: Diagnosis not present

## 2023-05-11 DIAGNOSIS — R928 Other abnormal and inconclusive findings on diagnostic imaging of breast: Secondary | ICD-10-CM

## 2023-07-15 DIAGNOSIS — E559 Vitamin D deficiency, unspecified: Secondary | ICD-10-CM | POA: Diagnosis not present

## 2023-07-18 DIAGNOSIS — I10 Essential (primary) hypertension: Secondary | ICD-10-CM | POA: Diagnosis not present

## 2023-07-18 DIAGNOSIS — R5383 Other fatigue: Secondary | ICD-10-CM | POA: Diagnosis not present

## 2023-07-18 DIAGNOSIS — K5900 Constipation, unspecified: Secondary | ICD-10-CM | POA: Diagnosis not present

## 2023-07-18 DIAGNOSIS — E039 Hypothyroidism, unspecified: Secondary | ICD-10-CM | POA: Diagnosis not present

## 2023-07-18 DIAGNOSIS — R55 Syncope and collapse: Secondary | ICD-10-CM | POA: Diagnosis not present

## 2023-07-18 DIAGNOSIS — E559 Vitamin D deficiency, unspecified: Secondary | ICD-10-CM | POA: Diagnosis not present

## 2023-07-31 DIAGNOSIS — H0100B Unspecified blepharitis left eye, upper and lower eyelids: Secondary | ICD-10-CM | POA: Diagnosis not present

## 2023-07-31 DIAGNOSIS — H2513 Age-related nuclear cataract, bilateral: Secondary | ICD-10-CM | POA: Diagnosis not present

## 2023-07-31 DIAGNOSIS — H4322 Crystalline deposits in vitreous body, left eye: Secondary | ICD-10-CM | POA: Diagnosis not present

## 2023-07-31 DIAGNOSIS — H25013 Cortical age-related cataract, bilateral: Secondary | ICD-10-CM | POA: Diagnosis not present

## 2023-07-31 DIAGNOSIS — H524 Presbyopia: Secondary | ICD-10-CM | POA: Diagnosis not present

## 2023-07-31 DIAGNOSIS — H0100A Unspecified blepharitis right eye, upper and lower eyelids: Secondary | ICD-10-CM | POA: Diagnosis not present

## 2023-07-31 DIAGNOSIS — H5211 Myopia, right eye: Secondary | ICD-10-CM | POA: Diagnosis not present

## 2023-08-22 DIAGNOSIS — R5383 Other fatigue: Secondary | ICD-10-CM | POA: Diagnosis not present

## 2023-08-22 DIAGNOSIS — I10 Essential (primary) hypertension: Secondary | ICD-10-CM | POA: Diagnosis not present

## 2023-08-22 DIAGNOSIS — E785 Hyperlipidemia, unspecified: Secondary | ICD-10-CM | POA: Diagnosis not present

## 2023-08-29 DIAGNOSIS — I10 Essential (primary) hypertension: Secondary | ICD-10-CM | POA: Diagnosis not present

## 2023-08-29 DIAGNOSIS — Z789 Other specified health status: Secondary | ICD-10-CM | POA: Diagnosis not present

## 2023-08-29 DIAGNOSIS — R55 Syncope and collapse: Secondary | ICD-10-CM | POA: Diagnosis not present

## 2023-08-29 DIAGNOSIS — Z7185 Encounter for immunization safety counseling: Secondary | ICD-10-CM | POA: Diagnosis not present

## 2023-08-29 DIAGNOSIS — E782 Mixed hyperlipidemia: Secondary | ICD-10-CM | POA: Diagnosis not present

## 2023-10-02 ENCOUNTER — Other Ambulatory Visit: Payer: Medicare HMO

## 2024-01-06 DIAGNOSIS — Z0000 Encounter for general adult medical examination without abnormal findings: Secondary | ICD-10-CM | POA: Diagnosis not present

## 2024-01-15 DIAGNOSIS — H6121 Impacted cerumen, right ear: Secondary | ICD-10-CM | POA: Diagnosis not present

## 2024-02-17 DIAGNOSIS — R5383 Other fatigue: Secondary | ICD-10-CM | POA: Diagnosis not present

## 2024-02-17 DIAGNOSIS — E559 Vitamin D deficiency, unspecified: Secondary | ICD-10-CM | POA: Diagnosis not present

## 2024-02-20 DIAGNOSIS — E782 Mixed hyperlipidemia: Secondary | ICD-10-CM | POA: Diagnosis not present

## 2024-02-20 DIAGNOSIS — I10 Essential (primary) hypertension: Secondary | ICD-10-CM | POA: Diagnosis not present

## 2024-02-20 DIAGNOSIS — K5900 Constipation, unspecified: Secondary | ICD-10-CM | POA: Diagnosis not present

## 2024-02-20 DIAGNOSIS — E559 Vitamin D deficiency, unspecified: Secondary | ICD-10-CM | POA: Diagnosis not present

## 2024-03-26 DIAGNOSIS — I10 Essential (primary) hypertension: Secondary | ICD-10-CM | POA: Diagnosis not present

## 2024-03-26 DIAGNOSIS — Z0000 Encounter for general adult medical examination without abnormal findings: Secondary | ICD-10-CM | POA: Diagnosis not present

## 2024-03-26 DIAGNOSIS — Z1331 Encounter for screening for depression: Secondary | ICD-10-CM | POA: Diagnosis not present

## 2024-03-26 DIAGNOSIS — E559 Vitamin D deficiency, unspecified: Secondary | ICD-10-CM | POA: Diagnosis not present

## 2024-03-26 DIAGNOSIS — Z1339 Encounter for screening examination for other mental health and behavioral disorders: Secondary | ICD-10-CM | POA: Diagnosis not present

## 2024-04-10 DIAGNOSIS — M810 Age-related osteoporosis without current pathological fracture: Secondary | ICD-10-CM | POA: Diagnosis not present

## 2024-04-14 DIAGNOSIS — E559 Vitamin D deficiency, unspecified: Secondary | ICD-10-CM | POA: Diagnosis not present

## 2024-04-14 DIAGNOSIS — M810 Age-related osteoporosis without current pathological fracture: Secondary | ICD-10-CM | POA: Diagnosis not present

## 2024-04-14 DIAGNOSIS — Q788 Other specified osteochondrodysplasias: Secondary | ICD-10-CM | POA: Diagnosis not present

## 2024-09-09 DIAGNOSIS — E559 Vitamin D deficiency, unspecified: Secondary | ICD-10-CM | POA: Diagnosis not present
